# Patient Record
Sex: Male | Born: 1970 | Race: White | Hispanic: No | Marital: Single | Smoking: Current every day smoker
Health system: Southern US, Community
[De-identification: ages and names within clinical notes are randomized; demographics above are authoritative.]

## PROBLEM LIST (undated history)

## (undated) DIAGNOSIS — G43909 Migraine, unspecified, not intractable, without status migrainosus: Secondary | ICD-10-CM

## (undated) DIAGNOSIS — K219 Gastro-esophageal reflux disease without esophagitis: Secondary | ICD-10-CM

## (undated) DIAGNOSIS — R51 Headache: Secondary | ICD-10-CM

## (undated) DIAGNOSIS — R519 Headache, unspecified: Secondary | ICD-10-CM

## (undated) DIAGNOSIS — T7840XA Allergy, unspecified, initial encounter: Secondary | ICD-10-CM

## (undated) DIAGNOSIS — R7303 Prediabetes: Secondary | ICD-10-CM

## (undated) DIAGNOSIS — R569 Unspecified convulsions: Secondary | ICD-10-CM

## (undated) HISTORY — DX: Migraine, unspecified, not intractable, without status migrainosus: G43.909

## (undated) HISTORY — PX: APPENDECTOMY: SHX54

## (undated) HISTORY — DX: Unspecified convulsions: R56.9

## (undated) HISTORY — DX: Allergy, unspecified, initial encounter: T78.40XA

---

## 2015-03-16 ENCOUNTER — Emergency Department
Admit: 2015-03-16 | Disposition: A | Discharge status: Home / Self Care | Payer: Self-pay | Admitting: Emergency Medicine

## 2015-03-16 ENCOUNTER — Encounter: Payer: Self-pay | Admitting: Emergency Medicine

## 2015-03-16 LAB — CBC
HCT: 48.7 % (ref 40.0–52.0)
HGB: 16.2 g/dL (ref 13.0–18.0)
MCH: 30 pg (ref 26.0–34.0)
MCHC: 33.4 g/dL (ref 32.0–36.0)
MCV: 90 fL (ref 80–100)
Platelet: 206 10*3/uL (ref 150–440)
RBC: 5.41 10*6/uL (ref 4.40–5.90)
RDW: 14.8 % — ABNORMAL HIGH (ref 11.5–14.5)
WBC: 11.6 10*3/uL — AB (ref 3.8–10.6)

## 2015-03-16 LAB — BASIC METABOLIC PANEL
ANION GAP: 9 (ref 7–16)
BUN: 12 mg/dL
CHLORIDE: 102 mmol/L
Calcium, Total: 9.1 mg/dL
Co2: 27 mmol/L
Creatinine: 1.14 mg/dL
EGFR (African American): >60
EGFR (Non-African Amer.): >60
GLUCOSE: 93 mg/dL
Potassium: 4.2 mmol/L
Sodium: 138 mmol/L

## 2015-03-16 LAB — PROTIME-INR
INR: 0.9
PROTHROMBIN TIME: 12 s

## 2015-03-16 LAB — TROPONIN I: Troponin-I: <0.03 ng/mL

## 2015-04-09 ENCOUNTER — Encounter: Payer: Self-pay | Admitting: Emergency Medicine

## 2015-04-09 DIAGNOSIS — L03116 Cellulitis of left lower limb: Secondary | ICD-10-CM | POA: Insufficient documentation

## 2015-04-09 DIAGNOSIS — Z720 Tobacco use: Secondary | ICD-10-CM | POA: Insufficient documentation

## 2015-04-09 DIAGNOSIS — Z880 Allergy status to penicillin: Secondary | ICD-10-CM | POA: Insufficient documentation

## 2015-04-09 NOTE — ED Notes (Addendum)
Pt st last 3 days noted area of swelling/redness to left medial leg just below knee; now with pain into calf; denies hx of same; no known injury; skin hot to touch; +PP, brisk cap refill, W&D, brisk cap refill; pt recent car trip to [place]

## 2015-04-10 ENCOUNTER — Emergency Department
Admission: EM | Admit: 2015-04-10 | Discharge: 2015-04-10 | Disposition: A | Discharge status: Home / Self Care | Payer: Self-pay | Attending: Emergency Medicine | Admitting: Emergency Medicine

## 2015-04-10 ENCOUNTER — Emergency Department: Payer: Self-pay

## 2015-04-10 DIAGNOSIS — M79605 Pain in left leg: Secondary | ICD-10-CM

## 2015-04-10 DIAGNOSIS — L03116 Cellulitis of left lower limb: Secondary | ICD-10-CM

## 2015-04-10 MED ORDER — OXYCODONE-ACETAMINOPHEN 5-325 MG PO TABS
1.0000 | ORAL_TABLET | Freq: Once | ORAL | Status: AC
  Administered 2015-04-10: 1 via ORAL

## 2015-04-10 MED ORDER — SULFAMETHOXAZOLE-TRIMETHOPRIM 800-160 MG PO TABS: 2.0000 | ORAL_TABLET | Freq: Two times a day (BID) | ORAL | Status: DC

## 2015-04-10 MED ORDER — OXYCODONE-ACETAMINOPHEN 5-325 MG PO TABS
ORAL_TABLET | ORAL | Status: AC
  Administered 2015-04-10: 1 via ORAL
  Filled 2015-04-10: qty 1

## 2015-04-10 MED ORDER — IBUPROFEN 600 MG PO TABS: 600.0000 mg | ORAL_TABLET | Freq: Three times a day (TID) | ORAL | Status: DC | PRN

## 2015-04-10 MED ORDER — SULFAMETHOXAZOLE-TRIMETHOPRIM 800-160 MG PO TABS
ORAL_TABLET | ORAL | Status: AC
  Administered 2015-04-10: 2 via ORAL
  Filled 2015-04-10: qty 2

## 2015-04-10 MED ORDER — OXYCODONE-ACETAMINOPHEN 5-325 MG PO TABS: 1.0000 | ORAL_TABLET | ORAL | Status: DC | PRN

## 2015-04-10 MED ORDER — SULFAMETHOXAZOLE-TRIMETHOPRIM 800-160 MG PO TABS
2.0000 | ORAL_TABLET | Freq: Once | ORAL | Status: AC
  Administered 2015-04-10: 2 via ORAL

## 2015-04-10 NOTE — Discharge Instructions (Signed)
1. Take antibiotics as prescribed (Bactrim DS 2 tablets twice daily 10 days). 2. Take pain medicines as needed (Motrin/Percocet). 3. Elevate affected area several times a day and apply moist heat. 4. Return to the ER for worsening symptoms, fever, persistent vomiting or other concerns.  Cellulitis Cellulitis is an infection of the skin and the tissue beneath it. The infected area is usually red and tender. Cellulitis occurs most often in the arms and lower legs.  CAUSES  Cellulitis is caused by bacteria that enter the skin through cracks or cuts in the skin. The most common types of bacteria that cause cellulitis are staphylococci and streptococci. SIGNS AND SYMPTOMS   Redness and warmth.  Swelling.  Tenderness or pain.  Fever. DIAGNOSIS  Your health care provider can usually determine what is wrong based on a physical exam. Blood tests may also be done. TREATMENT  Treatment usually involves taking an antibiotic medicine. HOME CARE INSTRUCTIONS   Take your antibiotic medicine as directed by your health care provider. Finish the antibiotic even if you start to feel better.  Keep the infected arm or leg elevated to reduce swelling.  Apply a warm cloth to the affected area up to 4 times per day to relieve pain.  Take medicines only as directed by your health care provider.  Keep all follow-up visits as directed by your health care provider. SEEK MEDICAL CARE IF:   You notice red streaks coming from the infected area.  Your red area gets larger or turns dark in color.  Your bone or joint underneath the infected area becomes painful after the skin has healed.  Your infection returns in the same area or another area.  You notice a swollen bump in the infected area.  You develop new symptoms.  You have a fever. SEEK IMMEDIATE MEDICAL CARE IF:   You feel very sleepy.  You develop vomiting or diarrhea.  You have a general ill feeling (malaise) with muscle aches and  pains. MAKE SURE YOU:   Understand these instructions.  Will watch your condition.  Will get help right away if you are not doing well or get worse. Document Released: 08/18/2005 Document Revised: 03/25/2014 Document Reviewed: 01/24/2012 [facility] Patient Information 2015 [place], [place]. This information is not intended to replace advice given to you by your health care provider. Make sure you discuss any questions you have with your health care provider.  Heat Therapy Heat therapy can help ease sore, stiff, injured, and tight muscles and joints. Heat relaxes your muscles, which may help ease your pain.  RISKS AND COMPLICATIONS If you have any of the following conditions, do not use heat therapy unless your health care provider has approved:  Poor circulation.  Healing wounds or scarred skin in the area being treated.  Diabetes, heart disease, or high blood pressure.  Not being able to feel (numbness) the area being treated.  Unusual swelling of the area being treated.  Active infections.  Blood clots.  Cancer.  Inability to communicate pain. This may include young children and people who have problems with their brain function (dementia).  Pregnancy. Heat therapy should only be used on old, pre-existing, or long-lasting (chronic) injuries. Do not use heat therapy on new injuries unless directed by your health care provider. HOW TO USE HEAT THERAPY There are several different kinds of heat therapy, including:  Moist heat pack.  Warm water bath.  Hot water bottle.  Electric heating pad.  Heated gel pack.  Heated wrap.  IT trainer  heating pad. Use the heat therapy method suggested by your health care provider. Follow your health care provider's instructions on when and how to use heat therapy. GENERAL HEAT THERAPY RECOMMENDATIONS  Do not sleep while using heat therapy. Only use heat therapy while you are awake.  Your skin may turn pink while using heat therapy. Do  not use heat therapy if your skin turns red.  Do not use heat therapy if you have new pain.  High heat or long exposure to heat can cause burns. Be careful when using heat therapy to avoid burning your skin.  Do not use heat therapy on areas of your skin that are already irritated, such as with a rash or sunburn. SEEK MEDICAL CARE IF:  You have blisters, redness, swelling, or numbness.  You have new pain.  Your pain is worse. MAKE SURE YOU:  Understand these instructions.  Will watch your condition.  Will get help right away if you are not doing well or get worse. Document Released: 01/31/2012 Document Revised: 03/25/2014 Document Reviewed: 01/01/2014 [facility] Patient Information 2015 [place], [place]. This information is not intended to replace advice given to you by your health care provider. Make sure you discuss any questions you have with your health care provider.

## 2015-04-10 NOTE — ED Notes (Signed)

## 2015-04-10 NOTE — ED Provider Notes (Signed)
[facility] Emergency Department Provider Note  ____________________________________________  Time seen: Approximately 2:44 AM  I have reviewed the triage vital signs and the nursing notes.   HISTORY  Chief Complaint Leg Pain    HPI Todd Massey is a 44 y.o. male who presents with left lower extremity painand swelling 3 days. Patient complains of 7/10 pain to calf and posterior knee. History of recent car travel to [place]. Denies trauma/fall/injury. Noted area of swelling/redness to left medial leg just below the knee several days ago; area has not increased in size. Pain is exacerbated by standing and walking. Pain is unrelieved with elevation. Patient began to research his symptoms on the Internet and is concerned for a blood clot. Patient denies fever, chills, chest pain, shortness of breath, vomiting, diarrhea, numbness, tingling, weakness.   History reviewed. No pertinent past medical history.  No history of diabetes.  There are no active problems to display for this patient.   Past Surgical History  Procedure Laterality Date  . Appendectomy      Current Outpatient Rx  Name  Route  Sig  Dispense  Refill  . ibuprofen (ADVIL,MOTRIN) 600 MG tablet   Oral   Take 1 tablet (600 mg total) by mouth every 8 (eight) hours as needed.   15 tablet   0   . oxyCODONE-acetaminophen (ROXICET) 5-325 MG per tablet   Oral   Take 1 tablet by mouth every 4 (four) hours as needed for severe pain.   20 tablet   0   . sulfamethoxazole-trimethoprim (BACTRIM DS,SEPTRA DS) 800-160 MG per tablet   Oral   Take 2 tablets by mouth 2 (two) times daily.   40 tablet   0     Allergies Penicillins  No family history on file.  Social History History  Substance Use Topics  . Smoking status: Current Every Day Smoker -- 0.50 packs/day    Types: Cigarettes  . Smokeless tobacco: Not on file  . Alcohol Use: No    Review of Systems Constitutional: No  fever/chills Eyes: No visual changes. ENT: No sore throat. Cardiovascular: Denies chest pain. Respiratory: Denies shortness of breath. Gastrointestinal: No abdominal pain.  No nausea, no vomiting.  No diarrhea.  No constipation. Genitourinary: Negative for dysuria. Musculoskeletal: Negative for back pain. Positive for left lower extremity pain, redness and swelling. Skin: Negative for rash. Neurological: Negative for headaches, focal weakness or numbness.  10-point ROS otherwise negative.  ____________________________________________   PHYSICAL EXAM:  VITAL SIGNS: ED Triage Vitals  Enc Vitals Group     BP 04/09/15 2335 136/83 mmHg     Pulse Rate 04/09/15 2335 85     Resp 04/09/15 2335 18     Temp 04/09/15 2335 98.1 F (36.7 C)     Temp Source 04/09/15 2335 Oral     SpO2 04/09/15 2335 99 %     Weight 04/09/15 2335 190 lb (86.183 kg)     Height 04/09/15 2335 5\' 11"  (1.803 m)     Head Cir --      Peak Flow --      Pain Score 04/09/15 2335 7     Pain Loc --      Pain Edu? --      Excl. in [place]? --     Constitutional: Alert and oriented. Well appearing and in no acute distress. Eyes: Conjunctivae are normal. PERRL. EOMI. Head: Atraumatic. Nose: No congestion/rhinnorhea. Mouth/Throat: Mucous membranes are moist.  Oropharynx non-erythematous. Neck: No stridor.  Cardiovascular: Normal rate, regular rhythm. Grossly normal heart sounds.  Good peripheral circulation. Respiratory: Normal respiratory effort.  No retractions. Lungs CTAB. Gastrointestinal: Soft and nontender. No distention. No abdominal bruits. No CVA tenderness. Musculoskeletal: Right calf 40cm, left calf 41cm. Approximately 2 cm area of redness, warmth, swelling noted to left medial upper calf without induration or fluctuance. No bite mark noted. BLE symmetrically warm. 2+ distal pulses bilaterally. Full range of motion of left knee and ankle. Neurologic:  Normal speech and language. No gross focal neurologic  deficits are appreciated. Speech is normal. No gait instability. Skin:  Warmth and erythema to left medial upper calf as described above. No rash noted. Psychiatric: Mood and affect are normal. Speech and behavior are normal.  ____________________________________________   LABS (all labs ordered are listed, but only abnormal results are displayed)  Labs Reviewed - No data to display ____________________________________________  EKG  None ____________________________________________  RADIOLOGY  Left lower extremity venous Doppler ultrasound interpreted by Dr. Marisue Humble: No evidence of deep venous thrombosis. ____________________________________________   PROCEDURES  Procedure(s) performed: None  Critical Care performed: No  ____________________________________________   INITIAL IMPRESSION / ASSESSMENT AND PLAN / ED COURSE  Pertinent labs & imaging results that were available during my care of the patient were reviewed by me and considered in my medical decision making (see chart for details).  44 year old male who presents with left lower extremity pain, redness and swelling. Ultrasound negative for DVT. Symptoms more consistent with cellulitis. We will initiate antibiotics with Bactrim, analgesia, warm compresses and follow up PCP. Strict return precautions given. Patient and spouse verbalized understanding and agreed with plan of care. ____________________________________________   FINAL CLINICAL IMPRESSION(S) / ED DIAGNOSES  Final diagnoses:  Cellulitis of left lower extremity  Leg pain, medial, left      Paulette Blanch, MD 04/10/15 [phone]

## 2015-09-21 ENCOUNTER — Emergency Department
Admission: EM | Admit: 2015-09-21 | Discharge: 2015-09-21 | Disposition: A | Discharge status: Home / Self Care | Payer: Self-pay | Attending: Emergency Medicine | Admitting: Emergency Medicine

## 2015-09-21 ENCOUNTER — Emergency Department: Payer: Self-pay

## 2015-09-21 ENCOUNTER — Encounter: Payer: Self-pay | Admitting: Emergency Medicine

## 2015-09-21 DIAGNOSIS — Z720 Tobacco use: Secondary | ICD-10-CM | POA: Insufficient documentation

## 2015-09-21 DIAGNOSIS — Z79899 Other long term (current) drug therapy: Secondary | ICD-10-CM | POA: Insufficient documentation

## 2015-09-21 DIAGNOSIS — R079 Chest pain, unspecified: Secondary | ICD-10-CM | POA: Insufficient documentation

## 2015-09-21 DIAGNOSIS — Z880 Allergy status to penicillin: Secondary | ICD-10-CM | POA: Insufficient documentation

## 2015-09-21 LAB — COMPREHENSIVE METABOLIC PANEL
ALT: 43 U/L (ref 17–63)
ANION GAP: 9 (ref 5–15)
AST: 32 U/L (ref 15–41)
Albumin: 3.6 g/dL (ref 3.5–5.0)
Alkaline Phosphatase: 107 U/L (ref 38–126)
BILIRUBIN TOTAL: 0.3 mg/dL (ref 0.3–1.2)
BUN: 19 mg/dL (ref 6–20)
CHLORIDE: 106 mmol/L (ref 101–111)
CO2: 23 mmol/L (ref 22–32)
Calcium: 9.1 mg/dL (ref 8.9–10.3)
Creatinine, Ser: 1.09 mg/dL (ref 0.61–1.24)
Glucose, Bld: 99 mg/dL (ref 65–99)
POTASSIUM: 4 mmol/L (ref 3.5–5.1)
Sodium: 138 mmol/L (ref 135–145)
TOTAL PROTEIN: 7.1 g/dL (ref 6.5–8.1)

## 2015-09-21 LAB — CBC
HEMATOCRIT: 45.6 % (ref 40.0–52.0)
Hemoglobin: 15.7 g/dL (ref 13.0–18.0)
MCH: 30.3 pg (ref 26.0–34.0)
MCHC: 34.4 g/dL (ref 32.0–36.0)
MCV: 88.1 fL (ref 80.0–100.0)
PLATELETS: 186 10*3/uL (ref 150–440)
RBC: 5.17 MIL/uL (ref 4.40–5.90)
RDW: 14.3 % (ref 11.5–14.5)
WBC: 9.5 10*3/uL (ref 3.8–10.6)

## 2015-09-21 LAB — GLUCOSE, CAPILLARY: GLUCOSE-CAPILLARY: 95 mg/dL (ref 65–99)

## 2015-09-21 LAB — TROPONIN I
Troponin I: <0.03 ng/mL (ref <0.031)
Troponin I: <0.03 ng/mL (ref <0.031)

## 2015-09-21 MED ORDER — MORPHINE SULFATE (PF) 4 MG/ML IV SOLN
4.0000 mg | Freq: Once | INTRAVENOUS | Status: AC
  Administered 2015-09-21: 4 mg via INTRAVENOUS
  Filled 2015-09-21: qty 1

## 2015-09-21 MED ORDER — ASPIRIN 81 MG PO CHEW
324.0000 mg | CHEWABLE_TABLET | Freq: Once | ORAL | Status: AC
  Administered 2015-09-21: 324 mg via ORAL
  Filled 2015-09-21: qty 4

## 2015-09-21 MED ORDER — ONDANSETRON HCL 4 MG/2ML IJ SOLN
4.0000 mg | Freq: Once | INTRAMUSCULAR | Status: AC
  Administered 2015-09-21: 4 mg via INTRAVENOUS
  Filled 2015-09-21: qty 2

## 2015-09-21 NOTE — ED Notes (Signed)
MD at bedside for reeval

## 2015-09-21 NOTE — ED Notes (Signed)
Report given to [place], Shelly Bombard.

## 2015-09-21 NOTE — ED Notes (Signed)
Pt says he was at work when he began having pain to the upper left chest and left bicep area; tightness in chest; numbness and tingling to left arm; diaphoresis when pain started; pain also radiates around left axillary area;

## 2015-09-21 NOTE — ED Notes (Signed)
Discussed discharge instructions and follow-up care with patient. No questions or concerns at this time. Pt stable at discharge.  

## 2015-09-21 NOTE — ED Notes (Signed)
Assumed pt care at this time. NAD noted. RR even and nonlabored. Will continue to monitor. 

## 2015-09-21 NOTE — ED Provider Notes (Signed)
[facility] Emergency Department Provider Note  ____________________________________________  Time seen: Approximately 110 AM  I have reviewed the triage vital signs and the nursing notes.   HISTORY  Chief Complaint Chest Pain   HPI Todd Massey is a 44 y.o. male without any chronic medical conditions is presenting tonight with chest pain that started about an hour prior to arrival. He says that he was at work standing when he had sudden onset of left-sided chest pain. He said that it was associated with diaphoresis and nausea. He says he had a similar episode about 6 months ago was supposed to follow up with cardiology would never did. He says that his pain is decreased at this time and is a 4 out of 10. He says he had some radiation to his left upper extremity with tingling. He denies any family history of heart disease. Says he smokes about half pack of cigarettes per day. However, does not of any chronic medical conditions. He says that recently when he has been walking or exerting himself he has not noticed any chest pain or shortness of breath.   History reviewed. No pertinent past medical history.  There are no active problems to display for this patient.   Past Surgical History  Procedure Laterality Date  . Appendectomy      Current Outpatient Rx  Name  Route  Sig  Dispense  Refill  . ibuprofen (ADVIL,MOTRIN) 600 MG tablet   Oral   Take 1 tablet (600 mg total) by mouth every 8 (eight) hours as needed.   15 tablet   0   . oxyCODONE-acetaminophen (ROXICET) 5-325 MG per tablet   Oral   Take 1 tablet by mouth every 4 (four) hours as needed for severe pain.   20 tablet   0   . sulfamethoxazole-trimethoprim (BACTRIM DS,SEPTRA DS) 800-160 MG per tablet   Oral   Take 2 tablets by mouth 2 (two) times daily.   40 tablet   0     Allergies Penicillins  History reviewed. No pertinent family history.  Social History Social History  Substance  Use Topics  . Smoking status: Current Every Day Smoker -- 0.50 packs/day    Types: Cigarettes  . Smokeless tobacco: Never Used  . Alcohol Use: No    Review of Systems Constitutional: No fever/chills Eyes: No visual changes. ENT: No sore throat. Cardiovascular: As above Respiratory: Denies shortness of breath. Gastrointestinal: No abdominal pain.   no vomiting.  No diarrhea.  No constipation. Genitourinary: Negative for dysuria. Musculoskeletal: Negative for back pain. Skin: Negative for rash. Neurological: Negative for headaches, focal weakness or numbness.  10-point ROS otherwise negative.  ____________________________________________   PHYSICAL EXAM:  VITAL SIGNS: ED Triage Vitals  Enc Vitals Group     BP 09/21/15 0030 135/91 mmHg     Pulse Rate 09/21/15 0030 86     Resp 09/21/15 0030 18     Temp 09/21/15 0030 98.1 F (36.7 C)     Temp Source 09/21/15 0030 Oral     SpO2 09/21/15 0030 96 %     Weight 09/21/15 0030 226 lb (102.513 kg)     Height 09/21/15 0030 5' 11.5" (1.816 m)     Head Cir --      Peak Flow --      Pain Score 09/21/15 0038 6     Pain Loc --      Pain Edu? --      Excl. in [place]? --  Constitutional: Alert and oriented. Well appearing and in no acute distress. Eyes: Conjunctivae are normal. PERRL. EOMI. Head: Atraumatic. Nose: No congestion/rhinnorhea. Mouth/Throat: Mucous membranes are moist.  Oropharynx non-erythematous. Neck: No stridor.   Cardiovascular: Normal rate, regular rhythm. Grossly normal heart sounds.  Good peripheral circulation. Chest pain is reproducible on palpation of the left side of the chest. Respiratory: Normal respiratory effort.  No retractions. Lungs CTAB. Gastrointestinal: Soft and nontender. No distention. No abdominal bruits. No CVA tenderness. Musculoskeletal: No lower extremity tenderness nor edema.  No joint effusions. Neurologic:  Normal speech and language. No gross focal neurologic deficits are appreciated. No  gait instability. Skin:  Skin is warm, dry and intact. No rash noted. Psychiatric: Mood and affect are normal. Speech and behavior are normal.  ____________________________________________   LABS (all labs ordered are listed, but only abnormal results are displayed)  Labs Reviewed  CBC  COMPREHENSIVE METABOLIC PANEL  TROPONIN I  GLUCOSE, CAPILLARY  TROPONIN I   ____________________________________________  EKG  ED ECG REPORT I, [place],  Youlanda Roys, the attending physician, personally viewed and interpreted this ECG.   Date: 09/21/2015  EKG Time: 12:36 AM  Rate: 76  Rhythm: normal sinus rhythm  Axis: Normal axis  Intervals:none  ST&T Change: No ST segment elevation or depression. No abnormal T-wave inversion.  ED ECG REPORT I, Doran Stabler, the attending physician, personally viewed and interpreted this ECG.   Date: 09/21/2015  EKG Time: 0055  Rate: 76  Rhythm: normal sinus rhythm  Axis: Normal axis  Intervals:none  ST&T Change: No abnormal ST elevation or depression. No abnormal T-wave inversion. No significant change from previous.   ____________________________________________  RADIOLOGY  No edema or consolidation on the chest x-ray. ____________________________________________   PROCEDURES  ____________________________________________   INITIAL IMPRESSION / ASSESSMENT AND PLAN / ED COURSE  Pertinent labs & imaging results that were available during my care of the patient were reviewed by me and considered in my medical decision making (see chart for details).  ----------------------------------------- 6:59 AM on 09/21/2015 -----------------------------------------  Patient is chest pain-free at this time and resting comfortably. I discussed with him his lab results as well as his EKG and imaging which are all reassuring. He says that he has similar episode 6 months ago as well. I discussed the case with Dr. Humphrey Rolls of cardiology who will see the  patient tomorrow at 10 AM. I discussed with the patient the possibility of admission but he would rather be seen in the office. He is concerned because he does not have insurance. However, Dr. Humphrey Rolls has agreed to see him. Will be discharged home. Has aspirin at home and will begin taking a daily baby aspirin. He has a family member at the bedside and understands the plan and is willing to comply. Has a heart score of 3 which makes him low risk. PERC negative. ____________________________________________   FINAL CLINICAL IMPRESSION(S) / ED DIAGNOSES  Chest pain.    Todd Pyo, MD 09/21/15 [phone]

## 2015-12-10 ENCOUNTER — Other Ambulatory Visit: Payer: Self-pay | Admitting: Physician Assistant

## 2015-12-10 ENCOUNTER — Other Ambulatory Visit (HOSPITAL_COMMUNITY): Payer: Self-pay | Admitting: Physician Assistant

## 2015-12-10 DIAGNOSIS — R1310 Dysphagia, unspecified: Secondary | ICD-10-CM

## 2015-12-10 DIAGNOSIS — R1011 Right upper quadrant pain: Secondary | ICD-10-CM

## 2015-12-17 ENCOUNTER — Ambulatory Visit
Admission: RE | Admit: 2015-12-17 | Discharge: 2015-12-17 | Disposition: A | Discharge status: Home / Self Care | Payer: BLUE CROSS/BLUE SHIELD | Source: Ambulatory Visit | Attending: Physician Assistant | Admitting: Physician Assistant

## 2015-12-17 DIAGNOSIS — R1310 Dysphagia, unspecified: Secondary | ICD-10-CM

## 2015-12-26 ENCOUNTER — Ambulatory Visit (HOSPITAL_COMMUNITY)
Admission: RE | Admit: 2015-12-26 | Discharge: 2015-12-26 | Disposition: A | Discharge status: Home / Self Care | Payer: BLUE CROSS/BLUE SHIELD | Source: Ambulatory Visit | Attending: Physician Assistant | Admitting: Physician Assistant

## 2015-12-26 DIAGNOSIS — R1011 Right upper quadrant pain: Secondary | ICD-10-CM | POA: Diagnosis present

## 2015-12-26 MED ORDER — SINCALIDE 5 MCG IJ SOLR
0.0200 ug/kg | Freq: Once | INTRAMUSCULAR | Status: AC
  Administered 2015-12-26: 2.2 ug via INTRAVENOUS

## 2015-12-26 MED ORDER — TECHNETIUM TC 99M MEBROFENIN IV KIT
5.4000 | PACK | Freq: Once | INTRAVENOUS | Status: AC | PRN
  Administered 2015-12-26: 5 via INTRAVENOUS

## 2016-03-24 ENCOUNTER — Encounter (HOSPITAL_COMMUNITY): Payer: Self-pay | Admitting: *Deleted

## 2016-03-30 ENCOUNTER — Other Ambulatory Visit: Payer: Self-pay | Admitting: Gastroenterology

## 2016-03-31 ENCOUNTER — Encounter (HOSPITAL_COMMUNITY)
Admission: RE | Disposition: A | Discharge status: Home / Self Care | Payer: Self-pay | Source: Ambulatory Visit | Attending: Gastroenterology

## 2016-03-31 ENCOUNTER — Encounter (HOSPITAL_COMMUNITY): Payer: Self-pay

## 2016-03-31 ENCOUNTER — Ambulatory Visit (HOSPITAL_COMMUNITY): Payer: BLUE CROSS/BLUE SHIELD | Admitting: Anesthesiology

## 2016-03-31 ENCOUNTER — Ambulatory Visit (HOSPITAL_COMMUNITY)
Admission: RE | Admit: 2016-03-31 | Discharge: 2016-03-31 | Disposition: A | Discharge status: Home / Self Care | Payer: BLUE CROSS/BLUE SHIELD | Source: Ambulatory Visit | Attending: Gastroenterology | Admitting: Gastroenterology

## 2016-03-31 DIAGNOSIS — K3189 Other diseases of stomach and duodenum: Secondary | ICD-10-CM | POA: Diagnosis not present

## 2016-03-31 DIAGNOSIS — Z79899 Other long term (current) drug therapy: Secondary | ICD-10-CM | POA: Insufficient documentation

## 2016-03-31 DIAGNOSIS — R1013 Epigastric pain: Secondary | ICD-10-CM | POA: Insufficient documentation

## 2016-03-31 DIAGNOSIS — F17210 Nicotine dependence, cigarettes, uncomplicated: Secondary | ICD-10-CM | POA: Diagnosis not present

## 2016-03-31 DIAGNOSIS — E785 Hyperlipidemia, unspecified: Secondary | ICD-10-CM | POA: Insufficient documentation

## 2016-03-31 DIAGNOSIS — R945 Abnormal results of liver function studies: Secondary | ICD-10-CM | POA: Diagnosis present

## 2016-03-31 DIAGNOSIS — K317 Polyp of stomach and duodenum: Secondary | ICD-10-CM | POA: Diagnosis not present

## 2016-03-31 DIAGNOSIS — K219 Gastro-esophageal reflux disease without esophagitis: Secondary | ICD-10-CM | POA: Insufficient documentation

## 2016-03-31 DIAGNOSIS — I10 Essential (primary) hypertension: Secondary | ICD-10-CM | POA: Diagnosis not present

## 2016-03-31 DIAGNOSIS — J449 Chronic obstructive pulmonary disease, unspecified: Secondary | ICD-10-CM | POA: Diagnosis not present

## 2016-03-31 HISTORY — DX: Gastro-esophageal reflux disease without esophagitis: K21.9

## 2016-03-31 HISTORY — DX: Headache: R51

## 2016-03-31 HISTORY — DX: Headache, unspecified: R51.9

## 2016-03-31 HISTORY — PX: EUS: SHX5427

## 2016-03-31 SURGERY — ESOPHAGEAL ENDOSCOPIC ULTRASOUND (EUS) RADIAL
Anesthesia: Monitor Anesthesia Care

## 2016-03-31 MED ORDER — SODIUM CHLORIDE 0.9 % IV SOLN: INTRAVENOUS | Status: DC

## 2016-03-31 MED ORDER — PROPOFOL 10 MG/ML IV BOLUS
INTRAVENOUS | Status: DC | PRN
Start: 2016-03-31 — End: 2016-03-31
  Administered 2016-03-31: 50 mg via INTRAVENOUS
  Administered 2016-03-31: 20 mg via INTRAVENOUS

## 2016-03-31 MED ORDER — PROPOFOL 10 MG/ML IV BOLUS
INTRAVENOUS | Status: AC
  Filled 2016-03-31: qty 40

## 2016-03-31 MED ORDER — PROPOFOL 10 MG/ML IV BOLUS
INTRAVENOUS | Status: AC
  Filled 2016-03-31: qty 20

## 2016-03-31 MED ORDER — LACTATED RINGERS IV SOLN
INTRAVENOUS | Status: DC
  Administered 2016-03-31: 1000 mL via INTRAVENOUS

## 2016-03-31 MED ORDER — LIDOCAINE HCL (CARDIAC) 20 MG/ML IV SOLN
INTRAVENOUS | Status: DC | PRN
  Administered 2016-03-31: 50 mg via INTRAVENOUS

## 2016-03-31 MED ORDER — LIDOCAINE HCL (CARDIAC) 20 MG/ML IV SOLN
INTRAVENOUS | Status: AC
  Filled 2016-03-31: qty 5

## 2016-03-31 MED ORDER — PROPOFOL 500 MG/50ML IV EMUL
INTRAVENOUS | Status: DC | PRN
  Administered 2016-03-31: 150 ug/kg/min via INTRAVENOUS

## 2016-03-31 NOTE — Transfer of Care (Signed)
Immediate Anesthesia Transfer of Care Note  Patient: Todd Massey  Procedure(s) Performed: Procedure(s): ESOPHAGEAL ENDOSCOPIC ULTRASOUND (EUS) RADIAL (N/A)  Patient Location: PACU  Anesthesia Type:MAC  Level of Consciousness: awake, alert  and oriented  Airway & Oxygen Therapy: Patient Spontanous Breathing and Patient connected to nasal cannula oxygen  Post-op Assessment: Report given to RN and Post -op Vital signs reviewed and stable  Post vital signs: Reviewed and stable  Last Vitals:  Filed Vitals:   03/31/16 0721  BP: 127/82  Pulse: 78  Temp: 36.6 C  Resp: 18    Last Pain: There were no vitals filed for this visit.       Complications: No apparent anesthesia complications

## 2016-03-31 NOTE — H&P (Signed)
Patient interval history reviewed.  Patient examined again.  There has been no change from documented H/P dated 03/30/16 (scanned into chart from our office) except as documented above.  Assessment:  1.  Epigastric and right upper quadrant abdominal pain. 2.  Elevated LFTs.  Plan:  1.  Endoscopy and upper endoscopic ultrasound. 2.  Risks (bleeding, infection, bowel perforation that could require surgery, sedation-related changes in cardiopulmonary systems), benefits (identification and possible treatment of source of symptoms, exclusion of certain causes of symptoms), and alternatives (watchful waiting, radiographic imaging studies, empiric medical treatment) of upper endoscopy and upper endoscopic ultrasound (EGD + EUS) were explained to patient/family in detail and patient wishes to proceed.

## 2016-03-31 NOTE — Anesthesia Postprocedure Evaluation (Signed)
Anesthesia Post Note  Patient: Todd Massey  Procedure(s) Performed: Procedure(s) (LRB): ESOPHAGEAL ENDOSCOPIC ULTRASOUND (EUS) RADIAL (N/A)  Patient location during evaluation: Endoscopy Anesthesia Type: MAC Level of consciousness: awake and alert Pain management: pain level controlled Vital Signs Assessment: post-procedure vital signs reviewed and stable Respiratory status: spontaneous breathing, nonlabored ventilation, respiratory function stable and patient connected to nasal cannula oxygen Cardiovascular status: stable and blood pressure returned to baseline Anesthetic complications: no    Last Vitals:  Filed Vitals:   03/31/16 0920 03/31/16 0930  BP: 116/62 118/80  Pulse: 79 72  Temp:    Resp: 14 19    Last Pain: There were no vitals filed for this visit.               [name]

## 2016-03-31 NOTE — Discharge Instructions (Signed)

## 2016-03-31 NOTE — Anesthesia Preprocedure Evaluation (Addendum)
Anesthesia Evaluation  Patient identified by MRN, date of birth, ID band Patient awake    Reviewed: Allergy & Precautions, NPO status , Patient's Chart, lab work & pertinent test results  Airway Mallampati: II  TM Distance: >3 FB Neck ROM: Full    Dental no notable dental hx.    Pulmonary Current Smoker,    Pulmonary exam normal breath sounds clear to auscultation       Cardiovascular negative cardio ROS Normal cardiovascular exam Rhythm:Regular Rate:Normal     Neuro/Psych negative neurological ROS  negative psych ROS   GI/Hepatic negative GI ROS, Neg liver ROS,   Endo/Other  negative endocrine ROS  Renal/GU negative Renal ROS  negative genitourinary   Musculoskeletal negative musculoskeletal ROS (+)   Abdominal   Peds negative pediatric ROS (+)  Hematology negative hematology ROS (+)   Anesthesia Other Findings   Reproductive/Obstetrics negative OB ROS                            Anesthesia Physical Anesthesia Plan  ASA: II  Anesthesia Plan: MAC   Post-op Pain Management:    Induction: Intravenous  Airway Management Planned: Natural Airway  Additional Equipment:   Intra-op Plan:   Post-operative Plan: Extubation in OR  Informed Consent: I have reviewed the patients History and Physical, chart, labs and discussed the procedure including the risks, benefits and alternatives for the proposed anesthesia with the patient or authorized representative who has indicated his/her understanding and acceptance.   Dental advisory given  Plan Discussed with: CRNA  Anesthesia Plan Comments:         Anesthesia Quick Evaluation

## 2016-03-31 NOTE — Op Note (Signed)
[facility] Patient Name: Todd Massey Procedure Date: 03/31/2016 MRN: [id number] Attending MD: Arta Silence , MD Date of Birth: Oct 26, 1971 CSN: [id number] Age: 45 Admit Type: Outpatient Procedure:                Upper EUS Indications:              Elevated liver enzymes, Epigastric abdominal pain,                            Abdominal pain in the right upper quadrant Providers:                Arta Silence, MD, Dustin Flock, RN, Alfonso Patten,                            Technician, [facility], CRNA Referring MD:              Medicines:                Propofol per Anesthesia Complications:            No immediate complications. Estimated blood loss:                            None. Estimated Blood Loss:     Estimated blood loss: none. Procedure:                Pre-Anesthesia Assessment:                           - Prior to the procedure, a History and Physical                            was performed, and patient medications and                            allergies were reviewed. The patient's tolerance of                            previous anesthesia was also reviewed. The risks                            and benefits of the procedure and the sedation                            options and risks were discussed with the patient.                            All questions were answered, and informed consent                            was obtained. Prior Anticoagulants: The patient has                            taken no previous anticoagulant or antiplatelet  agents. ASA Grade Assessment: II - A patient with                            mild systemic disease. After reviewing the risks                            and benefits, the patient was deemed in                            satisfactory condition to undergo the procedure.                           After obtaining informed consent, the endoscope was                            passed  under direct vision. Throughout the                            procedure, the patient's blood pressure, pulse, and                            oxygen saturations were monitored continuously. The                            [id number]) scope was introduced through                            the mouth, and advanced to the second part of                            duodenum. The [id number]) scope was                            introduced through the mouth, and advanced to the                            second part of duodenum. The upper EUS was                            accomplished without difficulty. The patient                            tolerated the procedure well. Scope In: Scope Out: Findings:      Endoscopic Finding :      The examined esophagus was normal.      A single 10 mm sessile polyp was found in the cardia. Biopsies were       taken with a cold forceps for histology.      The exam of the stomach was otherwise normal.      The duodenal bulb, first portion of the duodenum and second portion of       the duodenum were normal.      Endosonographic Finding :      There was no sign of significant endosonographic abnormality in the  lower third of the esophagus.      There was diffuse abnormal echotexture in the left lobe of the liver.       This was characterized by a hyperechoic appearance.      There was no sign of significant endosonographic abnormality in the       common bile duct, in the cystic duct and in the gallbladder. The maximum       diameter of the ducts were 3 mm. An unremarkable gallbladder was       identified.      There was no sign of significant endosonographic abnormality in the       pancreatic head, in the pancreatic body, in the pancreatic tail, in the       uncinate process of the pancreas and in the ampulla.      Vague 2 x 2 cm hypo/anechoic doppler-negative region near genu of       pancreas in gastrohepatic region; doesn't seem  part of pancreas, may be       cystic region or lymph node. Apparently not seen on patient's recent CT       scan. Impression:               - Normal esophagus.                           - A single gastric polyp. Biopsied.                           - Normal duodenal bulb, first portion of the                            duodenum and second portion of the duodenum.                           - There was no sign of significant pathology in the                            lower third of the esophagus.                           - There was diffuse abnormal echotexture in the                            left lobe of the liver. This was characterized by a                            hyperechoic appearance. Likely hepatic steatosis.                           - There was no sign of significant pathology in the                            common bile duct, in the cystic duct and in the                            gallbladder.                           -  There was no sign of significant pathology in the                            pancreatic head, in the pancreatic body, in the                            pancreatic tail and in the uncinate process of the                            pancreas.                           - Vague hypo/anechoic region in gastrohepatic                            region of doubtful significance.                           - No explanation for patient's abdominal pain was                            identified. Moderate Sedation:      None Recommendation:           - Discharge patient to home (via wheelchair).                           - Resume previous diet today.                           - Await path results.                           - Return to GI clinic in 6 weeks. Might consider                            MRI/MRCP for further evaluation of vague                            gastrohepatic region anomaly.                           - Return to referring physician as previously                             scheduled. Procedure Code(s):        --- Professional ---                           [phone], Esophagogastroduodenoscopy, flexible,                            transoral; with endoscopic ultrasound examination,                            including the esophagus, stomach, and either the  duodenum or a surgically altered stomach where the                            jejunum is examined distal to the anastomosis                           43239, 59, Esophagogastroduodenoscopy, flexible,                            transoral; with biopsy, single or multiple Diagnosis Code(s):        --- Professional ---                           K31.7, Polyp of stomach and duodenum                           R74.8, Abnormal levels of other serum enzymes                           R10.13, Epigastric pain                           R10.11, Right upper quadrant pain                           R93.2, Abnormal findings on diagnostic imaging of                            liver and biliary tract CPT copyright 2016 American [employer] Association. All rights reserved. The codes documented in this report are preliminary and upon coder review may  be revised to meet current compliance requirements. Arta Silence, MD 03/31/2016 9:04:36 AM This report has been signed electronically. Number of Addenda: 0

## 2016-04-01 ENCOUNTER — Encounter (HOSPITAL_COMMUNITY): Payer: Self-pay | Admitting: Gastroenterology

## 2016-04-09 ENCOUNTER — Other Ambulatory Visit: Payer: Self-pay | Admitting: Gastroenterology

## 2016-04-09 DIAGNOSIS — R1011 Right upper quadrant pain: Secondary | ICD-10-CM

## 2016-04-16 ENCOUNTER — Ambulatory Visit: Payer: BLUE CROSS/BLUE SHIELD

## 2016-04-29 ENCOUNTER — Ambulatory Visit: Payer: BLUE CROSS/BLUE SHIELD

## 2016-04-29 ENCOUNTER — Ambulatory Visit
Admission: RE | Admit: 2016-04-29 | Discharge: 2016-04-29 | Disposition: A | Discharge status: Home / Self Care | Payer: BLUE CROSS/BLUE SHIELD | Source: Ambulatory Visit | Attending: Gastroenterology | Admitting: Gastroenterology

## 2016-04-29 DIAGNOSIS — K760 Fatty (change of) liver, not elsewhere classified: Secondary | ICD-10-CM | POA: Diagnosis not present

## 2016-04-29 DIAGNOSIS — R1011 Right upper quadrant pain: Secondary | ICD-10-CM | POA: Diagnosis present

## 2016-04-29 MED ORDER — GADOBENATE DIMEGLUMINE 529 MG/ML IV SOLN
20.0000 mL | Freq: Once | INTRAVENOUS | Status: AC | PRN
  Administered 2016-04-29: 20 mL via INTRAVENOUS

## 2016-08-04 ENCOUNTER — Other Ambulatory Visit: Payer: Self-pay | Admitting: Gastroenterology

## 2016-08-04 DIAGNOSIS — R198 Other specified symptoms and signs involving the digestive system and abdomen: Secondary | ICD-10-CM

## 2016-08-09 ENCOUNTER — Inpatient Hospital Stay: Admission: RE | Admit: 2016-08-09 | Payer: BLUE CROSS/BLUE SHIELD | Source: Ambulatory Visit

## 2017-06-12 ENCOUNTER — Emergency Department
Admission: EM | Admit: 2017-06-12 | Discharge: 2017-06-12 | Disposition: A | Discharge status: Home / Self Care | Payer: BLUE CROSS/BLUE SHIELD | Attending: Emergency Medicine | Admitting: Emergency Medicine

## 2017-06-12 ENCOUNTER — Encounter: Payer: Self-pay | Admitting: Emergency Medicine

## 2017-06-12 ENCOUNTER — Emergency Department: Payer: BLUE CROSS/BLUE SHIELD

## 2017-06-12 DIAGNOSIS — Z79899 Other long term (current) drug therapy: Secondary | ICD-10-CM | POA: Diagnosis not present

## 2017-06-12 DIAGNOSIS — S39012A Strain of muscle, fascia and tendon of lower back, initial encounter: Secondary | ICD-10-CM | POA: Diagnosis not present

## 2017-06-12 DIAGNOSIS — Y999 Unspecified external cause status: Secondary | ICD-10-CM | POA: Diagnosis not present

## 2017-06-12 DIAGNOSIS — F17210 Nicotine dependence, cigarettes, uncomplicated: Secondary | ICD-10-CM | POA: Insufficient documentation

## 2017-06-12 DIAGNOSIS — Y939 Activity, unspecified: Secondary | ICD-10-CM | POA: Diagnosis not present

## 2017-06-12 DIAGNOSIS — M545 Low back pain: Secondary | ICD-10-CM | POA: Diagnosis present

## 2017-06-12 DIAGNOSIS — X58XXXA Exposure to other specified factors, initial encounter: Secondary | ICD-10-CM | POA: Diagnosis not present

## 2017-06-12 DIAGNOSIS — Y929 Unspecified place or not applicable: Secondary | ICD-10-CM | POA: Insufficient documentation

## 2017-06-12 LAB — URINALYSIS, COMPLETE (UACMP) WITH MICROSCOPIC
Bacteria, UA: NONE SEEN
Bilirubin Urine: NEGATIVE
Glucose, UA: NEGATIVE mg/dL
Hgb urine dipstick: NEGATIVE
KETONES UR: NEGATIVE mg/dL
LEUKOCYTES UA: NEGATIVE
NITRITE: NEGATIVE
PH: 6 (ref 5.0–8.0)
Protein, ur: NEGATIVE mg/dL
RBC / HPF: NONE SEEN RBC/hpf (ref 0–5)
Specific Gravity, Urine: 1.018 (ref 1.005–1.030)

## 2017-06-12 MED ORDER — ORPHENADRINE CITRATE 30 MG/ML IJ SOLN
60.0000 mg | INTRAMUSCULAR | Status: AC
  Administered 2017-06-12: 60 mg via INTRAMUSCULAR
  Filled 2017-06-12: qty 2

## 2017-06-12 MED ORDER — ORPHENADRINE CITRATE ER 100 MG PO TB12
100.0000 mg | ORAL_TABLET | Freq: Two times a day (BID) | ORAL | 0 refills | Status: DC | PRN
Start: 2017-06-12 — End: 2018-12-27

## 2017-06-12 MED ORDER — KETOROLAC TROMETHAMINE 10 MG PO TABS: 10.0000 mg | ORAL_TABLET | Freq: Three times a day (TID) | ORAL | 0 refills | Status: DC

## 2017-06-12 MED ORDER — KETOROLAC TROMETHAMINE 60 MG/2ML IM SOLN
60.0000 mg | Freq: Once | INTRAMUSCULAR | Status: AC
  Administered 2017-06-12: 60 mg via INTRAMUSCULAR
  Filled 2017-06-12: qty 2

## 2017-06-12 NOTE — ED Notes (Signed)
Pt states he has a hx of sciatica and went to PCP for treatment. Pt states treatment worked and that the pain he is experiencing today feel different.

## 2017-06-12 NOTE — ED Provider Notes (Signed)
[facility] Emergency Department Provider Note ____________________________________________  Time seen: 1104  I have reviewed the triage vital signs and the nursing notes.  HISTORY  Chief Complaint  Back Pain  HPI Todd Massey is a 46 y.o. male presents to the ED for evaluation of low back pain for the last 2 weeks. Patient describes a history of sciatica, reports that treatment through his primary care provider initially worked. He notes that was his first episode of sciatica, and was treated 5 weeks prior. He is experiencing pain today that he describes as different from his typical sciatica pain. He is reporting left flank pain and intermittent pain to the anterior left thigh. This pain has been persistent since 2 weeks ago. He notes the pain is crampy, constant, and steady in nature. He denies recent injury, trauma, or fall; and any bladder or bowel incontinence, foot drop, or leg weakness.He also denies hematuria, retention, or dysuria. He has dosed IBU 600 mg BID over the last day or so.   Past Medical History:  Diagnosis Date  . GERD (gastroesophageal reflux disease)   . Headache    migraines    There are no active problems to display for this patient.   Past Surgical History:  Procedure Laterality Date  . APPENDECTOMY    . EUS N/A 03/31/2016   Procedure: ESOPHAGEAL ENDOSCOPIC ULTRASOUND (EUS) RADIAL;  Surgeon: Arta Silence, MD;  Location: [facility] ENDOSCOPY;  Service: Endoscopy;  Laterality: N/A;    Prior to Admission medications   Medication Sig Start Date End Date Taking? Authorizing Provider  albuterol (PROAIR HFA) 108 (90 Base) MCG/ACT inhaler Inhale 2 puffs into the lungs every 6 (six) hours as needed for wheezing or shortness of breath.  11/04/15 11/03/16  [provider]  atorvastatin (LIPITOR) 40 MG tablet Take 40 mg by mouth daily.    [provider]  ibuprofen (ADVIL,MOTRIN) 200 MG tablet Take 400 mg by mouth every 6 (six)  hours as needed (For headache or body aches.).    [provider]  ketorolac (TORADOL) 10 MG tablet Take 1 tablet (10 mg total) by mouth every 8 (eight) hours. 06/12/17   Mckinley Adelstein, Dannielle Karvonen, PA-C  omeprazole (PRILOSEC) 40 MG capsule Take 40 mg by mouth daily.    [provider]  orphenadrine (NORFLEX) 100 MG tablet Take 1 tablet (100 mg total) by mouth 2 (two) times daily as needed for muscle spasms. 06/12/17   Haidyn Chadderdon, Dannielle Karvonen, PA-C    Allergies Penicillins  No family history on file.  Social History Social History  Substance Use Topics  . Smoking status: Current Every Day Smoker    Packs/day: 0.50    Types: Cigarettes  . Smokeless tobacco: Never Used  . Alcohol use No    Review of Systems  Constitutional: Negative for fever. Cardiovascular: Negative for chest pain. Respiratory: Negative for shortness of breath. Gastrointestinal: Negative for abdominal pain, vomiting and diarrhea. Reports left flank pain.  Genitourinary: Negative for dysuria. Musculoskeletal: Positive for back pain. Skin: Negative for rash. Neurological: Negative for headaches, focal weakness or numbness. ____________________________________________  PHYSICAL EXAM:  VITAL SIGNS: ED Triage Vitals  Enc Vitals Group     BP 06/12/17 1051 (!) 151/92     Pulse Rate 06/12/17 1051 78     Resp 06/12/17 1051 16     Temp 06/12/17 1051 98.7 F (37.1 C)     Temp Source 06/12/17 1051 Oral     SpO2 06/12/17 1051 96 %  Weight --      Height --      Head Circumference --      Peak Flow --      Pain Score 06/12/17 1049 8     Pain Loc --      Pain Edu? --      Excl. in [place]? --    Constitutional: Alert and oriented. Well appearing and in no distress. Head: Normocephalic and atraumatic. Cardiovascular: Normal rate, regular rhythm. Normal distal pulses. Respiratory: Normal respiratory effort. No wheezes/rales/rhonchi. Gastrointestinal: Soft and nontender. No  distention. Musculoskeletal: Normal spinal alignment without midline tenderness, spasm, deformity, or step-off. Patient is tender palpation primarily to the right thoracolumbar region and right flank. Normal lumbar extension and flexion ROM. He demonstrates normal toe & heel raises. Nontender with normal range of motion in all extremities.  Neurologic:  Mildly antalgic gait without ataxia. Negative supine SLE bilaterally. Normal LE DTRs bilaterally. Normal speech and language. No gross focal neurologic deficits are appreciated. Skin:  Skin is warm, dry and intact. No rash noted. ____________________________________________   LABS (pertinent positives/negatives)  Labs Reviewed  URINALYSIS, COMPLETE (UACMP) WITH MICROSCOPIC - Abnormal; Notable for the following:       Result Value   Color, Urine YELLOW (*)    APPearance CLEAR (*)    Squamous Epithelial / LPF 0-5 (*)    All other components within normal limits  ____________________________________________  RADIOLOGY  Lumbar Spine  IMPRESSION: No acute abnormalities.  I, Kalany Diekmann, Dannielle Karvonen, personally viewed and evaluated these images (plain radiographs) as part of my medical decision making, as well as reviewing the written report by the radiologist. ____________________________________________  PROCEDURES  Toradol 30 mg IM Norflex 60 mg IM ____________________________________________  INITIAL IMPRESSION / ASSESSMENT AND PLAN / ED COURSE  Patient with marked improvement of his symptoms after IM medication administration. He is reassured by his negative urinalysis and x-rays. He is agreeable to dose the prescription Toradol and Norflex. He will follow-up with his PCP or employee health nurse. Return precautions are reviewed. A work note is provided for tonight as requested.  ____________________________________________  FINAL CLINICAL IMPRESSION(S) / ED DIAGNOSES  Final diagnoses:  Strain of lumbar region, initial  encounter      Melvenia Needles, PA-C 06/12/17 1625    Lisa Roca, MD 06/14/17 [phone]

## 2017-06-12 NOTE — ED Triage Notes (Signed)
Pt reports lower back pain x 2 weeks, worse since Thursday.

## 2017-06-12 NOTE — Discharge Instructions (Signed)
Your exam and x-rays are essentially normal at this time. Take the prescription meds as directed. Apply ice or moist heat compresses to reduce pain. Follow-up with your provider or return to the ED for worsening symptoms.

## 2017-07-08 ENCOUNTER — Other Ambulatory Visit: Payer: Self-pay | Admitting: Internal Medicine

## 2017-07-08 DIAGNOSIS — M545 Low back pain: Secondary | ICD-10-CM

## 2017-07-20 ENCOUNTER — Ambulatory Visit
Admission: RE | Admit: 2017-07-20 | Discharge: 2017-07-20 | Disposition: A | Discharge status: Home / Self Care | Payer: 59 | Source: Ambulatory Visit | Attending: Internal Medicine | Admitting: Internal Medicine

## 2017-07-20 DIAGNOSIS — M5126 Other intervertebral disc displacement, lumbar region: Secondary | ICD-10-CM | POA: Diagnosis not present

## 2017-07-20 DIAGNOSIS — M48061 Spinal stenosis, lumbar region without neurogenic claudication: Secondary | ICD-10-CM | POA: Insufficient documentation

## 2017-07-20 DIAGNOSIS — M5136 Other intervertebral disc degeneration, lumbar region: Secondary | ICD-10-CM | POA: Insufficient documentation

## 2017-07-20 DIAGNOSIS — M545 Low back pain: Secondary | ICD-10-CM | POA: Diagnosis present

## 2017-08-05 ENCOUNTER — Encounter: Payer: Self-pay | Admitting: Gastroenterology

## 2018-12-27 ENCOUNTER — Other Ambulatory Visit: Payer: Self-pay

## 2018-12-27 ENCOUNTER — Ambulatory Visit: Payer: Managed Care, Other (non HMO) | Admitting: Physician Assistant

## 2018-12-27 ENCOUNTER — Encounter: Payer: Self-pay | Admitting: Physician Assistant

## 2018-12-27 VITALS — BP 133/87 | HR 77 | Temp 97.9°F | Resp 16 | Ht 72.0 in | Wt 244.0 lb

## 2018-12-27 DIAGNOSIS — Z720 Tobacco use: Secondary | ICD-10-CM | POA: Diagnosis not present

## 2018-12-27 DIAGNOSIS — Z114 Encounter for screening for human immunodeficiency virus [HIV]: Secondary | ICD-10-CM

## 2018-12-27 DIAGNOSIS — E669 Obesity, unspecified: Secondary | ICD-10-CM

## 2018-12-27 DIAGNOSIS — K219 Gastro-esophageal reflux disease without esophagitis: Secondary | ICD-10-CM

## 2018-12-27 DIAGNOSIS — E785 Hyperlipidemia, unspecified: Secondary | ICD-10-CM

## 2018-12-27 DIAGNOSIS — R7303 Prediabetes: Secondary | ICD-10-CM

## 2018-12-27 DIAGNOSIS — Z130 Encounter for screening for diseases of the blood and blood-forming organs and certain disorders involving the immune mechanism: Secondary | ICD-10-CM

## 2018-12-27 DIAGNOSIS — Z23 Encounter for immunization: Secondary | ICD-10-CM | POA: Diagnosis not present

## 2018-12-27 MED ORDER — OMEPRAZOLE 40 MG PO CPDR: 40.0000 mg | DELAYED_RELEASE_CAPSULE | Freq: Every day | ORAL | 0 refills | Status: DC

## 2018-12-27 MED ORDER — BUPROPION HCL ER (SR) 150 MG PO TB12: ORAL_TABLET | ORAL | 0 refills | Status: DC

## 2018-12-27 NOTE — Progress Notes (Signed)
Patient: Todd Massey, Male    DOB: 1971/09/18, 48 y.o.   MRN: [id number] Visit Date: 12/27/2018  Today's Provider: Trinna Post, PA-C   Chief Complaint  Patient presents with  . New Patient (Initial Visit)   Subjective:     Annual physical exam Todd Massey is a 48 y.o. male who presents today for health maintenance and complete physical. He feels well. He reports exercising none. He reports he is sleeping well. 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 Lives with fiance, two sons, and mother. Patient needs refill on GERD medication.  Tobacco Abuse: 1 pack a day for 30+ years. Not interested in quitting. Quit for one year before.   HLD: Made him feel "weird," felt nauseated. Took it for 6 months. Lipitor 20 mg daily previously.   Wt Readings from Last 3 Encounters:  12/27/18 244 lb (110.7 kg)  03/31/16 243 lb (110.2 kg)  12/26/15 242 lb (109.8 kg)   Prediabetes: Previously was   Breakfast: [employer] biscuit - soda - 32 ounces coke Lunch: [employer] - 4 for 4 nuggets, double stack, fries, and drink 1-2 ounces 12 ounces  -----------------------------------------------------------------   Review of Systems  Constitutional: Negative.   HENT: Positive for dental problem, hearing loss and tinnitus.   Eyes: Negative.   Respiratory: Negative.   Cardiovascular: Negative.   Gastrointestinal: Positive for abdominal pain.  Endocrine: Negative.   Genitourinary: Negative.   Musculoskeletal: Negative.   Skin: Negative.   Allergic/Immunologic: Positive for environmental allergies.  Neurological: Positive for dizziness, light-headedness, numbness and headaches.  Hematological: Negative.   Psychiatric/Behavioral: Positive for agitation.    Social History      He  reports that he has been smoking cigarettes. He has a 15.00 pack-year smoking history. He has never used smokeless tobacco. He reports that he does not drink alcohol or use drugs.         Social History   Socioeconomic History  . Marital status: Single    Spouse name: Not on file  . Number of children: Not on file  . Years of education: Not on file  . Highest education level: Not on file  Occupational History  . Not on file  Social Needs  . Financial resource strain: Not on file  . Food insecurity:    Worry: Not on file    Inability: Not on file  . Transportation needs:    Medical: Not on file    Non-medical: Not on file  Tobacco Use  . Smoking status: Current Every Day Smoker    Packs/day: 0.50    Years: 30.00    Pack years: 15.00    Types: Cigarettes  . Smokeless tobacco: Never Used  Substance and Sexual Activity  . Alcohol use: No  . Drug use: No  . Sexual activity: Not on file  Lifestyle  . Physical activity:    Days per week: Not on file    Minutes per session: Not on file  . Stress: Not on file  Relationships  . Social connections:    Talks on phone: Not on file    Gets together: Not on file    Attends religious service: Not on file    Active member of club or organization: Not on file    Attends meetings of clubs or organizations: Not on file    Relationship status: Not on file  Other Topics Concern  . Not on file  Social History  Narrative  . Not on file    Past Medical History:  Diagnosis Date  . Allergy   . GERD (gastroesophageal reflux disease)   . Headache    migraines     There are no active problems to display for this patient.   Past Surgical History:  Procedure Laterality Date  . APPENDECTOMY    . EUS N/A 03/31/2016   Procedure: ESOPHAGEAL ENDOSCOPIC ULTRASOUND (EUS) RADIAL;  Surgeon: Arta Silence, MD;  Location: [facility] ENDOS[facility];  Service: Endoscopy;  Laterality: N/A;    Family History        Family Status  Relation Name Status  . Mother  Deceased        His family history includes Diabetes in his mother.      Allergies  Allergen Reactions  . Penicillins Rash and Other (See Comments)    Has patient had a PCN  reaction causing immediate rash, facial/tongue/throat swelling, SOB or lightheadedness with hypotension: yes Has patient had a PCN reaction causing severe rash involving mucus membranes or skin necrosis: no Has patient had a PCN reaction that required hospitalization no Has patient had a PCN reaction occurring within the last 10 years: no If all of the above answers are "NO", then may proceed with Cephalosporin use.      Current Outpatient Medications:  .  ibuprofen (ADVIL,MOTRIN) 200 MG tablet, Take 400 mg by mouth every 6 (six) hours as needed (For headache or body aches.)., Disp: , Rfl:  .  omeprazole (PRILOSEC) 40 MG capsule, Take 40 mg by mouth daily., Disp: , Rfl:    Patient Care Team: Paulene Floor as PCP - General (Physician Assistant)    Objective:    Vitals: BP 133/87 (BP Location: Right Arm, Patient Position: Sitting, Cuff Size: Normal)   Pulse 77   Temp 97.9 F (36.6 C) (Oral)   Resp 16   Ht 6' (1.829 m)   Wt 244 lb (110.7 kg)   BMI 33.09 kg/m    Vitals:   12/27/18 1418  BP: 133/87  Pulse: 77  Resp: 16  Temp: 97.9 F (36.6 C)  TempSrc: Oral  Weight: 244 lb (110.7 kg)  Height: 6' (1.829 m)     Physical Exam Constitutional:      Appearance: Normal appearance. He is obese.  HENT:     Right Ear: Tympanic membrane and ear canal normal.     Left Ear: Tympanic membrane and ear canal normal.  Cardiovascular:     Rate and Rhythm: Normal rate and regular rhythm.     Heart sounds: Normal heart sounds.  Pulmonary:     Effort: Pulmonary effort is normal.  Skin:    General: Skin is warm and dry.  Neurological:     Mental Status: He is alert and oriented to person, place, and time. Mental status is at baseline.  Psychiatric:        Mood and Affect: Mood normal.        Behavior: Behavior normal.      Depression Screen PHQ 2/9 Scores 12/27/2018  PHQ - 2 Score 1  PHQ- 9 Score 4       Assessment & Plan:     Routine Health Maintenance and  Physical Exam  Exercise Activities and Dietary recommendations Goals   None      There is no immunization history on file for this patient.  Health Maintenance  Topic Date Due  . HIV Screening  12/24/1985  . TETANUS/TDAP  12/24/1989  .  INFLUENZA VACCINE  06/22/2018     Discussed health benefits of physical activity, and encouraged him to engage in regular exercise appropriate for his age and condition.    1. Hyperlipidemia, unspecified hyperlipidemia type  Lipid profile extremely deranged with LDL > 200. Restarted lipitor 10 mg. Reports a history of elevated liver enzymes with extensive workup.  - Lipid Profile  2. Tobacco abuse  I have counseled >3 min on tobacco cessation and will start wellbutrin as below.  - buPROPion (WELLBUTRIN SR) 150 MG 12 hr tablet; Take one pill in the morning for three days. Then take one pill twice daily onwards.  Dispense: 90 tablet; Refill: 0  3. Gastroesophageal reflux disease, esophagitis presence not specified   - omeprazole (PRILOSEC) 40 MG capsule; Take 1 capsule (40 mg total) by mouth daily.  Dispense: 90 capsule; Refill: 0  4. Prediabetes  Remains prediabetic, consider metformin at next visit.   - Comprehensive Metabolic Panel (CMET) - HgB A1c  5. Obesity with serious comorbidity, unspecified classification, unspecified obesity type  - TSH  6. Encounter for screening for HIV  - HIV antibody (with reflex)  7. Screening for deficiency anemia  - CBC with Differential  8. Need for Tdap vaccination  - Tdap vaccine greater than or equal to 7yo IM  The entirety of the information documented in the History of Present Illness, Review of Systems and Physical Exam were personally obtained by me. Portions of this information were initially documented by Lynford Humphrey, CMA and reviewed by me for thoroughness and accuracy.   No follow-ups on  file.  --------------------------------------------------------------------    Trinna Post, PA-C  [place]

## 2018-12-28 LAB — CBC WITH DIFFERENTIAL/PLATELET
Basophils Absolute: 0.2 10*3/uL (ref 0.0–0.2)
Basos: 2 %
EOS (ABSOLUTE): 0.4 10*3/uL (ref 0.0–0.4)
Eos: 4 %
Hematocrit: 47.7 % (ref 37.5–51.0)
Hemoglobin: 16.6 g/dL (ref 13.0–17.7)
Immature Grans (Abs): 0 10*3/uL (ref 0.0–0.1)
Immature Granulocytes: 0 %
Lymphocytes Absolute: 3.7 10*3/uL — ABNORMAL HIGH (ref 0.7–3.1)
Lymphs: 38 %
MCH: 29.9 pg (ref 26.6–33.0)
MCHC: 34.8 g/dL (ref 31.5–35.7)
MCV: 86 fL (ref 79–97)
Monocytes Absolute: 0.8 10*3/uL (ref 0.1–0.9)
Monocytes: 9 %
Neutrophils Absolute: 4.4 10*3/uL (ref 1.4–7.0)
Neutrophils: 47 %
Platelets: 208 10*3/uL (ref 150–450)
RBC: 5.56 x10E6/uL (ref 4.14–5.80)
RDW: 13.7 % (ref 11.6–15.4)
WBC: 9.6 10*3/uL (ref 3.4–10.8)

## 2018-12-28 LAB — COMPREHENSIVE METABOLIC PANEL
ALT: 81 IU/L — ABNORMAL HIGH (ref 0–44)
AST: 45 IU/L — ABNORMAL HIGH (ref 0–40)
Albumin/Globulin Ratio: 1.6 (ref 1.2–2.2)
Albumin: 4.5 g/dL (ref 4.0–5.0)
Alkaline Phosphatase: 228 IU/L — ABNORMAL HIGH (ref 39–117)
BUN/Creatinine Ratio: 15 (ref 9–20)
BUN: 16 mg/dL (ref 6–24)
Bilirubin Total: 0.5 mg/dL (ref 0.0–1.2)
CO2: 21 mmol/L (ref 20–29)
Calcium: 9.5 mg/dL (ref 8.7–10.2)
Chloride: 102 mmol/L (ref 96–106)
Creatinine, Ser: 1.07 mg/dL (ref 0.76–1.27)
GFR calc Af Amer: 94 mL/min/{1.73_m2} (ref >59)
GFR calc non Af Amer: 82 mL/min/{1.73_m2} (ref >59)
Globulin, Total: 2.9 g/dL (ref 1.5–4.5)
Glucose: 64 mg/dL — ABNORMAL LOW (ref 65–99)
Potassium: 4.6 mmol/L (ref 3.5–5.2)
Sodium: 139 mmol/L (ref 134–144)
Total Protein: 7.4 g/dL (ref 6.0–8.5)

## 2018-12-28 LAB — HEMOGLOBIN A1C
Est. average glucose Bld gHb Est-mCnc: 123 mg/dL
Hgb A1c MFr Bld: 5.9 % — ABNORMAL HIGH (ref 4.8–5.6)

## 2018-12-28 LAB — LIPID PANEL
Chol/HDL Ratio: 8.4 ratio — ABNORMAL HIGH (ref 0.0–5.0)
Cholesterol, Total: 336 mg/dL — ABNORMAL HIGH (ref 100–199)
HDL: 40 mg/dL (ref >39)
LDL Calculated: 226 mg/dL — ABNORMAL HIGH (ref 0–99)
Triglycerides: 349 mg/dL — ABNORMAL HIGH (ref 0–149)
VLDL Cholesterol Cal: 70 mg/dL — ABNORMAL HIGH (ref 5–40)

## 2018-12-28 LAB — HIV ANTIBODY (ROUTINE TESTING W REFLEX): HIV Screen 4th Generation wRfx: NONREACTIVE

## 2018-12-28 LAB — TSH: TSH: 1.78 u[IU]/mL (ref 0.450–4.500)

## 2019-01-06 ENCOUNTER — Telehealth: Payer: Self-pay

## 2019-01-06 NOTE — Telephone Encounter (Signed)
lmtcb

## 2019-01-06 NOTE — Telephone Encounter (Signed)
-----   Message from Trinna Post, [place] sent at 01/03/2019  2:20 PM EST ----- HIV negative. His cholesterol is VERY HIGH. He is high risk of having a heart attack or stroke. I am going to send in 10 mg Lipitor to take nightly. He talked about being willing to take this medication at our office visit at a low dose. His liver enzymes are slightly elevated but he has been worked up for this. He is prediabetic but I am going to hold off on metformin because I just want him to focus on the lipitor or cholesterol medication. If he does not have OV schedule please do so for followup.

## 2019-01-09 NOTE — Telephone Encounter (Signed)
lmtcb

## 2019-01-10 ENCOUNTER — Encounter: Payer: Self-pay | Admitting: Physician Assistant

## 2019-01-11 ENCOUNTER — Other Ambulatory Visit: Payer: Self-pay | Admitting: Physician Assistant

## 2019-01-11 DIAGNOSIS — E785 Hyperlipidemia, unspecified: Secondary | ICD-10-CM

## 2019-01-11 MED ORDER — ROSUVASTATIN CALCIUM 10 MG PO TABS
10.0000 mg | ORAL_TABLET | Freq: Every day | ORAL | 3 refills | Status: DC
Start: 2019-01-11 — End: 2019-01-22

## 2019-01-11 NOTE — Progress Notes (Unsigned)
cre 

## 2019-01-11 NOTE — Telephone Encounter (Signed)
Patient has viewed results on mychart. °

## 2019-01-21 ENCOUNTER — Encounter: Payer: Self-pay | Admitting: Physician Assistant

## 2019-01-21 DIAGNOSIS — E785 Hyperlipidemia, unspecified: Secondary | ICD-10-CM

## 2019-01-23 MED ORDER — ROSUVASTATIN CALCIUM 10 MG PO TABS
10.0000 mg | ORAL_TABLET | Freq: Every day | ORAL | 3 refills | Status: DC
Start: 2019-01-23 — End: 2019-03-01

## 2019-01-25 ENCOUNTER — Ambulatory Visit: Payer: Self-pay | Admitting: Physician Assistant

## 2019-01-27 ENCOUNTER — Other Ambulatory Visit: Payer: Self-pay | Admitting: Physician Assistant

## 2019-01-27 DIAGNOSIS — Z720 Tobacco use: Secondary | ICD-10-CM

## 2019-01-28 ENCOUNTER — Encounter: Payer: Self-pay | Admitting: Physician Assistant

## 2019-01-30 ENCOUNTER — Ambulatory Visit: Payer: Self-pay | Admitting: Physician Assistant

## 2019-01-30 ENCOUNTER — Ambulatory Visit: Payer: Managed Care, Other (non HMO) | Admitting: Physician Assistant

## 2019-01-30 ENCOUNTER — Encounter: Payer: Self-pay | Admitting: Physician Assistant

## 2019-01-30 VITALS — BP 136/87 | HR 81 | Temp 97.8°F | Wt 240.6 lb

## 2019-01-30 DIAGNOSIS — M545 Low back pain, unspecified: Secondary | ICD-10-CM

## 2019-01-30 LAB — POCT URINALYSIS DIPSTICK
Bilirubin, UA: NEGATIVE
Blood, UA: NEGATIVE
Glucose, UA: NEGATIVE
Ketones, UA: NEGATIVE
Leukocytes, UA: NEGATIVE
Nitrite, UA: NEGATIVE
Protein, UA: NEGATIVE
Spec Grav, UA: 1.015 (ref 1.010–1.025)
Urobilinogen, UA: 0.2 E.U./dL
pH, UA: 6 (ref 5.0–8.0)

## 2019-01-30 MED ORDER — MELOXICAM 7.5 MG PO TABS: 7.5000 mg | ORAL_TABLET | Freq: Every day | ORAL | 0 refills | Status: DC

## 2019-01-30 NOTE — Progress Notes (Signed)
Patient: Todd Massey Male    DOB: July 08, 1971   48 y.o.   MRN: [id number] Visit Date: 01/30/2019  Today's Provider: Trinna Post, PA-C   Chief Complaint  Patient presents with  . Back Pain   Subjective:    Back Pain  The current episode started more than 1 month ago. The problem occurs intermittently. The problem has been gradually worsening since onset. Pain location: right lower back per pt. Quality: sharp pain. The pain does not radiate. The pain is at a severity of 6/10. The pain is moderate. The pain is the same all the time. The symptoms are aggravated by bending, lying down, position, sitting, standing and twisting. Stiffness is present all day. Associated symptoms include headaches. Pertinent negatives include no abdominal pain or leg pain. He has tried NSAIDs for the symptoms. The treatment provided no relief.   Patient works doing frequent heavy lifting.    Allergies  Allergen Reactions  . Penicillins Rash and Other (See Comments)    Has patient had a PCN reaction causing immediate rash, facial/tongue/throat swelling, SOB or lightheadedness with hypotension: yes Has patient had a PCN reaction causing severe rash involving mucus membranes or skin necrosis: no Has patient had a PCN reaction that required hospitalization no Has patient had a PCN reaction occurring within the last 10 years: no If all of the above answers are "NO", then may proceed with Cephalosporin use.      Current Outpatient Medications:  .  buPROPion (WELLBUTRIN SR) 150 MG 12 hr tablet, Take one pill in the morning for three days. Then take one pill twice daily onwards., Disp: 90 tablet, Rfl: 0 .  ibuprofen (ADVIL,MOTRIN) 200 MG tablet, Take 400 mg by mouth every 6 (six) hours as needed (For headache or body aches.)., Disp: , Rfl:  .  omeprazole (PRILOSEC) 40 MG capsule, Take 1 capsule (40 mg total) by mouth daily., Disp: 90 capsule, Rfl: 0 .  rosuvastatin (CRESTOR) 10 MG tablet, Take 1 tablet  (10 mg total) by mouth daily., Disp: 90 tablet, Rfl: 3  Review of Systems  Gastrointestinal: Negative for abdominal pain.  Musculoskeletal: Positive for back pain.  Neurological: Positive for headaches.    Social History   Tobacco Use  . Smoking status: Current Every Day Smoker    Packs/day: 0.50    Years: 30.00    Pack years: 15.00    Types: Cigarettes  . Smokeless tobacco: Never Used  Substance Use Topics  . Alcohol use: No      Objective:   BP 136/87 (BP Location: Left Arm, Patient Position: Sitting, Cuff Size: Large)   Pulse 81   Temp 97.8 F (36.6 C) (Oral)   Wt 240 lb 9.6 oz (109.1 kg)   SpO2 96%   BMI 32.63 kg/m  Vitals:   01/30/19 1224  BP: 136/87  Pulse: 81  Temp: 97.8 F (36.6 C)  TempSrc: Oral  SpO2: 96%  Weight: 240 lb 9.6 oz (109.1 kg)     Physical Exam Constitutional:      Appearance: Normal appearance.  Cardiovascular:     Rate and Rhythm: Normal rate and regular rhythm.     Heart sounds: Normal heart sounds.  Pulmonary:     Effort: Pulmonary effort is normal.     Breath sounds: Normal breath sounds.  Abdominal:     General: Abdomen is flat. Bowel sounds are normal.     Palpations: Abdomen is soft.     Tenderness: There  is no abdominal tenderness. There is no right CVA tenderness or left CVA tenderness.  Musculoskeletal:        General: Tenderness present.       Arms:  Skin:    General: Skin is warm and dry.  Neurological:     Mental Status: He is alert and oriented to person, place, and time. Mental status is at baseline.  Psychiatric:        Mood and Affect: Mood normal.        Behavior: Behavior normal.         Assessment & Plan    1. Right low back pain, unspecified chronicity, unspecified whether sciatica present  Urinalysis normal. Patient extremely point tender along right paraspinal muscle. Explained to patient that due to tenderness, exacerbation with  Movement, lack of urinary findings or systemic infectious  signs/symptoms, this is most likely muscle strain. May use anti inflammatory and flexeril. Counseled may take several weeks to heal, can use warm heat or ice.   - POCT urinalysis dipstick - meloxicam (MOBIC) 7.5 MG tablet; Take 1 tablet (7.5 mg total) by mouth daily.  Dispense: 30 tablet; Refill: 0  The entirety of the information documented in the History of Present Illness, Review of Systems and Physical Exam were personally obtained by me. Portions of this information were initially documented by [facility], CMA and reviewed by me for thoroughness and accuracy.         Trinna Post, PA-C  [facility]

## 2019-01-30 NOTE — Patient Instructions (Signed)
Acute Back Pain, Adult  Acute back pain is sudden and usually short-lived. It is often caused by an injury to the muscles and tissues in the back. The injury may result from:   A muscle or ligament getting overstretched or torn (strained). Ligaments are tissues that connect bones to each other. Lifting something improperly can cause a back strain.   Wear and tear (degeneration) of the spinal disks. Spinal disks are circular tissue that provides cushioning between the bones of the spine (vertebrae).   Twisting motions, such as while playing sports or doing yard work.   A hit to the back.   Arthritis.  You may have a physical exam, lab tests, and imaging tests to find the cause of your pain. Acute back pain usually goes away with rest and home care.  Follow these instructions at home:  Managing pain, stiffness, and swelling   Take over-the-counter and prescription medicines only as told by your health care provider.   Your health care provider may recommend applying ice during the first 24-48 hours after your pain starts. To do this:  ? Put ice in a plastic bag.  ? Place a towel between your skin and the bag.  ? Leave the ice on for 20 minutes, 2-3 times a day.   If directed, apply heat to the affected area as often as told by your health care provider. Use the heat source that your health care provider recommends, such as a moist heat pack or a heating pad.  ? Place a towel between your skin and the heat source.  ? Leave the heat on for 20-30 minutes.  ? Remove the heat if your skin turns bright red. This is especially important if you are unable to feel pain, heat, or cold. You have a greater risk of getting burned.  Activity     Do not stay in bed. Staying in bed for more than 1-2 days can delay your recovery.   Sit up and stand up straight. Avoid leaning forward when you sit, or hunching over when you stand.  ? If you work at a desk, sit close to it so you do not need to lean over. Keep your chin tucked  in. Keep your neck drawn back, and keep your elbows bent at a right angle. Your arms should look like the letter "L."  ? Sit high and close to the steering wheel when you drive. Add lower back (lumbar) support to your car seat, if needed.   Take short walks on even surfaces as soon as you are able. Try to increase the length of time you walk each day.   Do not sit, drive, or stand in one place for more than 30 minutes at a time. Sitting or standing for long periods of time can put stress on your back.   Do not drive or use heavy machinery while taking prescription pain medicine.   Use proper lifting techniques. When you bend and lift, use positions that put less stress on your back:  ? Bend your knees.  ? Keep the load close to your body.  ? Avoid twisting.   Exercise regularly as told by your health care provider. Exercising helps your back heal faster and helps prevent back injuries by keeping muscles strong and flexible.   Work with a physical therapist to make a safe exercise program, as recommended by your health care provider. Do any exercises as told by your physical therapist.  Lifestyle   Maintain   a healthy weight. Extra weight puts stress on your back and makes it difficult to have good posture.   Avoid activities or situations that make you feel anxious or stressed. Stress and anxiety increase muscle tension and can make back pain worse. Learn ways to manage anxiety and stress, such as through exercise.  General instructions   Sleep on a firm mattress in a comfortable position. Try lying on your side with your knees slightly bent. If you lie on your back, put a pillow under your knees.   Follow your treatment plan as told by your health care provider. This may include:  ? Cognitive or behavioral therapy.  ? Acupuncture or massage therapy.  ? Meditation or yoga.  Contact a health care provider if:   You have pain that is not relieved with rest or medicine.   You have increasing pain going down  into your legs or buttocks.   Your pain does not improve after 2 weeks.   You have pain at night.   You lose weight without trying.   You have a fever or chills.  Get help right away if:   You develop new bowel or bladder control problems.   You have unusual weakness or numbness in your arms or legs.   You develop nausea or vomiting.   You develop abdominal pain.   You feel faint.  Summary   Acute back pain is sudden and usually short-lived.   Use proper lifting techniques. When you bend and lift, use positions that put less stress on your back.   Take over-the-counter and prescription medicines and apply heat or ice as directed by your health care provider.  This information is not intended to replace advice given to you by your health care provider. Make sure you discuss any questions you have with your health care provider.  Document Released: 11/08/2005 Document Revised: 06/15/2018 Document Reviewed: 06/22/2017  Elsevier Interactive Patient Education  2019 [employer]vier Inc[employer]

## 2019-02-19 ENCOUNTER — Observation Stay: Payer: Managed Care, Other (non HMO)

## 2019-02-19 ENCOUNTER — Other Ambulatory Visit: Payer: Self-pay

## 2019-02-19 ENCOUNTER — Observation Stay
Admission: EM | Admit: 2019-02-19 | Discharge: 2019-02-20 | Disposition: A | Discharge status: Home / Self Care | Payer: Managed Care, Other (non HMO) | Attending: Internal Medicine | Admitting: Internal Medicine

## 2019-02-19 ENCOUNTER — Telehealth: Payer: Self-pay

## 2019-02-19 ENCOUNTER — Encounter: Payer: Self-pay | Admitting: Emergency Medicine

## 2019-02-19 ENCOUNTER — Observation Stay
Admit: 2019-02-19 | Discharge: 2019-02-19 | Disposition: A | Discharge status: Home / Self Care | Payer: Managed Care, Other (non HMO) | Attending: Internal Medicine | Admitting: Internal Medicine

## 2019-02-19 ENCOUNTER — Emergency Department: Payer: Managed Care, Other (non HMO)

## 2019-02-19 DIAGNOSIS — Z79899 Other long term (current) drug therapy: Secondary | ICD-10-CM | POA: Diagnosis not present

## 2019-02-19 DIAGNOSIS — K219 Gastro-esophageal reflux disease without esophagitis: Secondary | ICD-10-CM | POA: Insufficient documentation

## 2019-02-19 DIAGNOSIS — R748 Abnormal levels of other serum enzymes: Secondary | ICD-10-CM

## 2019-02-19 DIAGNOSIS — E785 Hyperlipidemia, unspecified: Secondary | ICD-10-CM | POA: Diagnosis not present

## 2019-02-19 DIAGNOSIS — Z791 Long term (current) use of non-steroidal anti-inflammatories (NSAID): Secondary | ICD-10-CM | POA: Insufficient documentation

## 2019-02-19 DIAGNOSIS — F17210 Nicotine dependence, cigarettes, uncomplicated: Secondary | ICD-10-CM | POA: Insufficient documentation

## 2019-02-19 DIAGNOSIS — K760 Fatty (change of) liver, not elsewhere classified: Secondary | ICD-10-CM | POA: Insufficient documentation

## 2019-02-19 DIAGNOSIS — R200 Anesthesia of skin: Principal | ICD-10-CM | POA: Diagnosis present

## 2019-02-19 DIAGNOSIS — G43109 Migraine with aura, not intractable, without status migrainosus: Secondary | ICD-10-CM | POA: Diagnosis not present

## 2019-02-19 LAB — DIFFERENTIAL
Abs Immature Granulocytes: 0.03 10*3/uL (ref 0.00–0.07)
Basophils Absolute: 0.1 10*3/uL (ref 0.0–0.1)
Basophils Relative: 1 %
Eosinophils Absolute: 0.3 10*3/uL (ref 0.0–0.5)
Eosinophils Relative: 3 %
Immature Granulocytes: 0 %
Lymphocytes Relative: 32 %
Lymphs Abs: 3.8 10*3/uL (ref 0.7–4.0)
Monocytes Absolute: 0.7 10*3/uL (ref 0.1–1.0)
Monocytes Relative: 6 %
NEUTROS ABS: 6.8 10*3/uL (ref 1.7–7.7)
Neutrophils Relative %: 58 %

## 2019-02-19 LAB — CBC
HCT: 48.4 % (ref 39.0–52.0)
HEMOGLOBIN: 16.3 g/dL (ref 13.0–17.0)
MCH: 29.9 pg (ref 26.0–34.0)
MCHC: 33.7 g/dL (ref 30.0–36.0)
MCV: 88.8 fL (ref 80.0–100.0)
Platelets: 188 10*3/uL (ref 150–400)
RBC: 5.45 MIL/uL (ref 4.22–5.81)
RDW: 13.5 % (ref 11.5–15.5)
WBC: 11.8 10*3/uL — ABNORMAL HIGH (ref 4.0–10.5)
nRBC: 0 % (ref 0.0–0.2)

## 2019-02-19 LAB — COMPREHENSIVE METABOLIC PANEL
ALT: 59 U/L — ABNORMAL HIGH (ref 0–44)
AST: 38 U/L (ref 15–41)
Albumin: 4.4 g/dL (ref 3.5–5.0)
Alkaline Phosphatase: 141 U/L — ABNORMAL HIGH (ref 38–126)
Anion gap: 9 (ref 5–15)
BUN: 18 mg/dL (ref 6–20)
CHLORIDE: 106 mmol/L (ref 98–111)
CO2: 22 mmol/L (ref 22–32)
CREATININE: 1.18 mg/dL (ref 0.61–1.24)
Calcium: 9.2 mg/dL (ref 8.9–10.3)
GFR calc Af Amer: >60 mL/min (ref >60)
GFR calc non Af Amer: >60 mL/min (ref >60)
Glucose, Bld: 107 mg/dL — ABNORMAL HIGH (ref 70–99)
Potassium: 4 mmol/L (ref 3.5–5.1)
Sodium: 137 mmol/L (ref 135–145)
Total Bilirubin: 0.8 mg/dL (ref 0.3–1.2)
Total Protein: 7.8 g/dL (ref 6.5–8.1)

## 2019-02-19 LAB — GAMMA GT: GGT: 340 U/L — ABNORMAL HIGH (ref 7–50)

## 2019-02-19 LAB — GLUCOSE, CAPILLARY: GLUCOSE-CAPILLARY: 137 mg/dL — AB (ref 70–99)

## 2019-02-19 LAB — SEDIMENTATION RATE: Sed Rate: 3 mm/hr (ref 0–15)

## 2019-02-19 LAB — APTT: aPTT: 26 seconds (ref 24–36)

## 2019-02-19 LAB — PROTIME-INR
INR: 0.9 (ref 0.8–1.2)
Prothrombin Time: 12 seconds (ref 11.4–15.2)

## 2019-02-19 LAB — C-REACTIVE PROTEIN: CRP: 0.9 mg/dL (ref <1.0)

## 2019-02-19 MED ORDER — ACETAMINOPHEN 160 MG/5ML PO SOLN
650.0000 mg | ORAL | Status: DC | PRN
  Filled 2019-02-19: qty 20.3

## 2019-02-19 MED ORDER — ENOXAPARIN SODIUM 40 MG/0.4ML ~~LOC~~ SOLN: 40.0000 mg | SUBCUTANEOUS | Status: DC

## 2019-02-19 MED ORDER — SODIUM CHLORIDE 0.9% FLUSH
3.0000 mL | Freq: Once | INTRAVENOUS | Status: DC
Start: 2019-02-19 — End: 2019-02-19
  Administered 2019-02-19: 3 mL via INTRAVENOUS

## 2019-02-19 MED ORDER — ATORVASTATIN CALCIUM 20 MG PO TABS
40.0000 mg | ORAL_TABLET | Freq: Every day | ORAL | Status: DC
  Administered 2019-02-19: 17:00:00 40 mg via ORAL
  Filled 2019-02-19: qty 2

## 2019-02-19 MED ORDER — STROKE: EARLY STAGES OF RECOVERY BOOK
Freq: Once | Status: AC
  Administered 2019-02-19: 13:00:00

## 2019-02-19 MED ORDER — ACETAMINOPHEN 325 MG PO TABS: 650.0000 mg | ORAL_TABLET | ORAL | Status: DC | PRN

## 2019-02-19 MED ORDER — BUPROPION HCL ER (SR) 150 MG PO TB12
150.0000 mg | ORAL_TABLET | Freq: Two times a day (BID) | ORAL | Status: DC
  Filled 2019-02-19 (×4): qty 1

## 2019-02-19 MED ORDER — ASPIRIN 81 MG PO CHEW
324.0000 mg | CHEWABLE_TABLET | Freq: Once | ORAL | Status: AC
  Administered 2019-02-19: 324 mg via ORAL
  Filled 2019-02-19: qty 4

## 2019-02-19 MED ORDER — ACETAMINOPHEN 650 MG RE SUPP: 650.0000 mg | RECTAL | Status: DC | PRN

## 2019-02-19 MED ORDER — PANTOPRAZOLE SODIUM 40 MG PO TBEC
40.0000 mg | DELAYED_RELEASE_TABLET | Freq: Every day | ORAL | Status: DC
  Administered 2019-02-20: 40 mg via ORAL
  Filled 2019-02-19: qty 1

## 2019-02-19 MED ORDER — ASPIRIN 81 MG PO CHEW
81.0000 mg | CHEWABLE_TABLET | Freq: Every day | ORAL | Status: DC
  Administered 2019-02-20: 81 mg via ORAL
  Filled 2019-02-19: qty 1

## 2019-02-19 MED ORDER — SENNOSIDES-DOCUSATE SODIUM 8.6-50 MG PO TABS: 1.0000 | ORAL_TABLET | Freq: Every evening | ORAL | Status: DC | PRN

## 2019-02-19 NOTE — ED Notes (Signed)
First Nurse Note: Patient states he was fine when he woke up.  Sx onset approx 30 before arrival.  Dr. Jimmye Norman consulted, Code Stroke called.

## 2019-02-19 NOTE — ED Notes (Signed)
Code  Stroke  Called  To 333 

## 2019-02-19 NOTE — H&P (Signed)
[place] [facility] [place] NAME: Todd Massey    MR#:  [id number]  DATE OF BIRTH:  1971-05-26  DATE OF ADMISSION:  02/19/2019  PRIMARY CARE PHYSICIAN: Trinna Post, PA-C   REQUESTING/REFERRING PHYSICIAN: Lenise Arena, MD  CHIEF COMPLAINT:   Chief Complaint  Patient presents with  . Numbness    HISTORY OF PRESENT ILLNESS:  Todd Massey  is a 48 y.o. male with a known history of migraines and GERD who presented to the ED with left arm numbness for the last couple of hours.  He states that the numbness started out of nowhere.  His numbness has completely resolved.  He denies any weakness or tingling in his left upper extremity.  He denies any numbness in any of his lower extremities.  No visual changes or recent headaches.  He states that he developed left arm numbness a couple of months ago.  The numbness lasted for 5 to 10 minutes and resolved on its own.  In the ED, vitals were unremarkable.  Labs were significant for mildly elevated alk phos and ALT.  Code stroke was called.  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  Hospitalists were called for admission.  PAST MEDICAL HISTORY:   Past Medical History:  Diagnosis Date  . Allergy   . GERD (gastroesophageal reflux disease)   . Headache    migraines    PAST SURGICAL HISTORY:   Past Surgical History:  Procedure Laterality Date  . APPENDECTOMY    . EUS N/A 03/31/2016   Procedure: ESOPHAGEAL ENDOSCOPIC ULTRASOUND (EUS) RADIAL;  Surgeon: Arta Silence, MD;  Location: [facility];  Service: Endoscopy;  Laterality: N/A;    SOCIAL HISTORY:   Social History   Tobacco Use  . Smoking status: Current Every Day Smoker    Packs/day: 0.50    Years: 30.00    Pack years: 15.00    Types: Cigarettes  . Smokeless tobacco: Never Used  Substance Use Topics  . Alcohol use: No    FAMILY HISTORY:   Family History  Problem  Relation Age of Onset  . Diabetes Mother     DRUG ALLERGIES:   Allergies  Allergen Reactions  . Penicillins Rash and Other (See Comments)    Has patient had a PCN reaction causing immediate rash, facial/tongue/throat swelling, SOB or lightheadedness with hypotension: yes Has patient had a PCN reaction causing severe rash involving mucus membranes or skin necrosis: no Has patient had a PCN reaction that required hospitalization no Has patient had a PCN reaction occurring within the last 10 years: no If all of the above answers are "NO", then may proceed with Cephalosporin use.     REVIEW OF SYSTEMS:   Review of Systems  Constitutional: Negative for chills and fever.  HENT: Negative for congestion and sore throat.   Eyes: Negative for blurred vision and double vision.  Respiratory: Negative for cough and shortness of breath.   Cardiovascular: Negative for chest pain and palpitations.  Gastrointestinal: Negative for nausea and vomiting.  Genitourinary: Negative for dysuria and urgency.  Musculoskeletal: Negative for neck pain.  Neurological: Positive for sensory change. Negative for dizziness, tingling, focal weakness and headaches.  Psychiatric/Behavioral: Negative for depression. The patient is not nervous/anxious.     MEDICATIONS AT HOME:   Prior to Admission medications   Medication Sig Start Date End Date Taking? Authorizing Provider  buPROPion (WELLBUTRIN SR) 150 MG 12 hr  tablet Take 1 tablet (150 mg total) by mouth 2 (two) times daily. 01/31/19 05/01/19  Trinna Post, PA-C  ibuprofen (ADVIL,MOTRIN) 200 MG tablet Take 400 mg by mouth every 6 (six) hours as needed (For headache or body aches.).    [provider]  meloxicam (MOBIC) 7.5 MG tablet Take 1 tablet (7.5 mg total) by mouth daily. 01/30/19   Trinna Post, PA-C  omeprazole (PRILOSEC) 40 MG capsule Take 1 capsule (40 mg total) by mouth daily. 12/27/18 03/27/19  Trinna Post, PA-C  rosuvastatin  (CRESTOR) 10 MG tablet Take 1 tablet (10 mg total) by mouth daily. 01/23/19   Trinna Post, PA-C      VITAL SIGNS:  Blood pressure 119/78, pulse 72, temperature 98.1 F (36.7 C), temperature source Oral, resp. rate 16, height 6' (1.829 m), weight 107 kg, SpO2 97 %.  PHYSICAL EXAMINATION:  Physical Exam  GENERAL:  48 y.o.-year-old patient lying in the bed with no acute distress.  EYES: Pupils equal, round, reactive to light and accommodation. No scleral icterus. Extraocular muscles intact.  HEENT: Head atraumatic, normocephalic. Oropharynx and nasopharynx clear.  NECK:  Supple, no jugular venous distention. No thyroid enlargement, no tenderness.  LUNGS: Normal breath sounds bilaterally, no wheezing, rales,rhonchi or crepitation. No use of accessory muscles of respiration.  CARDIOVASCULAR: RRR, S1, S2 normal. No murmurs, rubs, or gallops.  ABDOMEN: Soft, nontender, nondistended. Bowel sounds present. No organomegaly or mass.  EXTREMITIES: No pedal edema, cyanosis, or clubbing.  NEUROLOGIC: Cranial nerves II through XII are intact. Muscle strength 5/5 in all extremities. Sensation intact throughout all extremities.  Normal finger-to-nose testing bilaterally.  Normal heel-to-shin testing bilaterally. Gait not checked.  PSYCHIATRIC: The patient is alert and oriented x 3.  SKIN: No obvious rash, lesion, or ulcer.   LABORATORY PANEL:   CBC Recent Labs  Lab 02/19/19 1037  WBC 11.8*  HGB 16.3  HCT 48.4  PLT 188   ------------------------------------------------------------------------------------------------------------------  Chemistries  No results for input(s): NA, K, CL, CO2, GLUCOSE, BUN, CREATININE, CALCIUM, MG, AST, ALT, ALKPHOS, BILITOT in the last 168 hours.  Invalid input(s): GFRCGP ------------------------------------------------------------------------------------------------------------------  Cardiac Enzymes No results for input(s): TROPONINI in the last 168 hours.  ------------------------------------------------------------------------------------------------------------------  RADIOLOGY:  Ct Head Code Stroke Wo Contrast`  Result Date: 02/19/2019 CLINICAL DATA:  Code stroke. Left arm numbness beginning 30 minutes ago. EXAM: CT HEAD WITHOUT CONTRAST TECHNIQUE: Contiguous axial images were obtained from the base of the skull through the vertex without intravenous contrast. COMPARISON:  None. FINDINGS: Brain: There is no evidence of acute infarct, intracranial hemorrhage, mass, midline shift, or extra-axial fluid collection. The ventricles and sulci are normal. A well-defined subcentimeter hypodensity laterally in the left cerebellar hemisphere is most compatible with a chronic infarct. Vascular: Calcified atherosclerosis at the skull base, advanced for age. No hyperdense vessel. Skull: No fracture or suspicious osseous lesion. Sinuses/Orbits: Mild right posterior ethmoid air cell mucosal thickening. Clear mastoid air cells. Unremarkable orbits. Other: None. ASPECTS [facility] Stroke Program Early CT Score) - Ganglionic level infarction (caudate, lentiform nuclei, internal capsule, insula, M1-M3 cortex): 7 - Supraganglionic infarction (M4-M6 cortex): 3 Total score (0-10 with 10 being normal): 10 IMPRESSION: 1. No evidence of acute intracranial abnormality. 2. ASPECTS is 10. 3. Small chronic cerebellar infarct. These results were called by telephone at the time of interpretation on 02/19/2019 at 10:47 am to Dr. Lenise Arena , who verbally acknowledged these results. Electronically Signed   By: Logan Bores M.D.   On: 02/19/2019 10:48  IMPRESSION AND PLAN:   Left arm numbness- likely TIA, as numbness has resolved in the ED. -MRI brain pending -Neurology consult -Echo and carotid Dopplers -A1c and lipid panel -Start aspirin 19m daily -Change statin to Lipitor 40 mg daily (patient unable to tolerate Crestor 20 mg in the past) -PT/OT consults.  No need for  SLP, as patient is not having any speech or swallowing issues.  Elevated alkaline phosphatase/elevated ALT- has been elevated in the past.  No alcohol use. -Will check hepatitis panel, GGT, RUQ [place] Chronic anxiety-stable -Continue home Wellbutrin  GERD-stable -Continue home PPI  Hyperlipidemia-stable -Check lipid panel -Change statin to Lipitor 40 mg daily (patient has been unable to tolerate 20 mg Crestor in the past due to side effects)  All the records are reviewed and case discussed with ED provider. Management plans discussed with the patient, family and they are in agreement.  CODE STATUS: Full  TOTAL TIME TAKING CARE OF THIS PATIENT: 45 minutes.    KBerna SpareMayo M.D on 02/19/2019 at 11:05 AM  Between 7am to 6pm - Pager - [phone] After 6pm go to [email or web address] - password [email or web address] [place] [facility]  Office  [phone] CC: Primary care physician; PTrinna Post PA-C   Note: This dictation was prepared with Dragon dictation along with smaller phrase technology. Any transcriptional errors that result from this process are unintentional.

## 2019-02-19 NOTE — Telephone Encounter (Signed)
Noted, thanks!

## 2019-02-19 NOTE — Telephone Encounter (Signed)
Patient called with his girlfriend on the line requesting an appointment asap. Patient woke up this morning with numbness and weakness of the left arm. He is also having some headaches. He denies any chest pain or shortness of breath. Patient states he has no feeling in his left arm and he cant lift it. I advised patient that he should go to the ER asap for evaluation to rule out possible stroke. Patient and his girlfriend verbally voiced understanding and agreed to go to the ER. Patient was advised to take 325mg  of ASA if he had it available.

## 2019-02-19 NOTE — Progress Notes (Signed)
PT Cancellation Note  Patient Details Name: Todd Massey MRN: [id number] DOB: 1971/09/27   Cancelled Treatment:    Reason Eval/Treat Not Completed: PT screened, no needs identified, will sign off. PT consult received, chart reviewed, patient reports symptoms completely resolved. Patient noted to be independent with transfers, ambulation in room without AD, modified BERG scored 56/56. Patient reports no questions, concerns, and/or PT needs at this time. Patient reports he is at his baseline.  No acute PT needs identified, nurse notified.   Janna Arch, PT, DPT   02/19/2019, 2:42 PM

## 2019-02-19 NOTE — Progress Notes (Signed)
*  PRELIMINARY RESULTS* Echocardiogram 2D Echocardiogram has been performed.  Todd Massey Todd Massey 02/19/2019, 3:52 PM

## 2019-02-19 NOTE — Consult Note (Addendum)
Referring Physician: Lenise Arena, MD    Chief Complaint: Left hand numbness and tingling sensation  HPI: Todd Massey is an 48 y.o. male with past medical history of coronary artery disease, hypertension, migraine headaches and GERD presenting to the ED today with chief complaints of left hand numbness and tingling sensation since 9:30 am.  Patient reports that he was at work and while trying to lift a box had difficulty and almost dropped it on the floor.  He has had similar episode in the past 2 to 3 months that resolved on its own without residual deficit. Denies associated altered sensorium, speech abnormality, cranial nerve deficit, seizures, focal motor deficits, diplopia, nausea or vomiting, dizziness, syncope or LOC. He however reports having left temporal headache in the past few days prior to this episode. The headache is intermittent, moderate to severe in intensity, located predominantly in the left temporal area. He describes pain as sharp and throbbing without associated vision disturbance. On arrival to the ED, he was afebrile with blood pressure 136/58mm Hg and pulse rate 81 beats/min. There were no focal neurological deficits; he was alert and oriented x4, and he did not demonstrate any memory deficits.  Initial NIH stroke scale 1.  A non-contrast head CT showed no acute infarction, small chronic cerebellar infarct noted. His clinical presentation was not suggestive of large vessel occlusive disease therefore patient was not a candidate for thrombectomy.  He was also not deemed candidate for tPA thrombolytics because of resolved symptoms with no residual disabling symptoms.  Due to his stroke risk factors he will be admitted for further stroke work-up and management.  Date last known well: Date: 02/19/2019 Time last known well: Time: 09:30 tPA Given: No: Resolving non-disabling symptoms.  Past Medical History:  Diagnosis Date  . Allergy   . GERD (gastroesophageal reflux disease)    . Headache    migraines    Past Surgical History:  Procedure Laterality Date  . APPENDECTOMY    . EUS N/A 03/31/2016   Procedure: ESOPHAGEAL ENDOSCOPIC ULTRASOUND (EUS) RADIAL;  Surgeon: Arta Silence, MD;  Location: [facility];  Service: Endoscopy;  Laterality: N/A;    Family History  Problem Relation Age of Onset  . Diabetes Mother    Social History:  reports that he has been smoking cigarettes. He has a 15.00 pack-year smoking history. He has never used smokeless tobacco. He reports that he does not drink alcohol or use drugs.  Allergies:  Allergies  Allergen Reactions  . Penicillins Rash and Other (See Comments)    Has patient had a PCN reaction causing immediate rash, facial/tongue/throat swelling, SOB or lightheadedness with hypotension: yes Has patient had a PCN reaction causing severe rash involving mucus membranes or skin necrosis: no Has patient had a PCN reaction that required hospitalization no Has patient had a PCN reaction occurring within the last 10 years: no If all of the above answers are "NO", then may proceed with Cephalosporin use.     Medications: I have reviewed the patient's current medications. Prior to Admission: (Not in a hospital admission)  Scheduled:  ROS: History obtained from the patient   General ROS: negative for - chills, fatigue, fever, night sweats, weight gain or weight loss Psychological ROS: negative for - behavioral disorder, hallucinations, memory difficulties, mood swings or suicidal ideation Ophthalmic ROS: negative for - blurry vision, double vision, eye pain or loss of vision ENT ROS: negative for - epistaxis, nasal discharge, oral lesions, sore throat, tinnitus or vertigo Allergy and Immunology  ROS: negative for - hives or itchy/watery eyes Hematological and Lymphatic ROS: negative for - bleeding problems, bruising or swollen lymph nodes Endocrine ROS: negative for - galactorrhea, hair pattern changes, polydipsia/polyuria  or temperature intolerance Respiratory ROS: negative for - cough, hemoptysis, shortness of breath or wheezing Cardiovascular ROS: negative for - chest pain, dyspnea on exertion, edema or irregular heartbeat Gastrointestinal ROS: negative for - abdominal pain, diarrhea, hematemesis, nausea/vomiting or stool incontinence Genito-Urinary ROS: negative for - dysuria, hematuria, incontinence or urinary frequency/urgency Musculoskeletal ROS: negative for - joint swelling or muscular weakness Neurological ROS: as noted in HPI Dermatological ROS: negative for rash and skin lesion changes  Physical Examination: Blood pressure (!) 134/93, pulse 71, temperature 98.1 F (36.7 C), temperature source Oral, resp. rate 11, height 6' (1.829 m), weight 107 kg, SpO2 96 %.   HEENT-  Normocephalic, no lesions, without obvious abnormality.  Normal external eye and conjunctiva.  Normal TM's bilaterally.  Normal auditory canals and external ears. Normal external nose, mucus membranes and septum.  Normal pharynx. Cardiovascular- S1, S2 normal, pulses palpable throughout   Lungs- chest clear, no wheezing, rales, normal symmetric air entry Abdomen- soft, non-tender; bowel sounds normal; no masses,  no organomegaly Extremities- no edema Lymph-no adenopathy palpable Musculoskeletal-no joint tenderness, deformity or swelling Skin-warm and dry, no hyperpigmentation, vitiligo, or suspicious lesions  Neurological Exam   Mental Status: Alert, oriented, thought content appropriate.  Speech fluent without evidence of aphasia.  Able to follow 3 step commands without difficulty. Attention span and concentration seemed appropriate  Cranial Nerves: II: Discs flat bilaterally; Visual fields grossly normal, pupils equal, round, reactive to light and accommodation III,IV, VI: ptosis not present, extra-ocular motions intact bilaterally V,VII: smile symmetric, facial light touch sensation intact VIII: hearing normal  bilaterally IX,X: gag reflex present XI: bilateral shoulder shrug XII: midline tongue extension Motor: Right :  Upper extremity   5/5 Without pronator drift      Left: Upper extremity   5/5 without pronator drift Right:   Lower extremity   5/5                                          Left: Lower extremity   5/5 Tone and bulk:normal tone throughout; no atrophy noted Sensory: Pinprick and light touch decreased in the left hand Deep Tendon Reflexes: 2+ and symmetric throughout Plantars: Right: mute                              Left: mute Cerebellar: Finger-to-nose testing intact bilaterally. Heel to shin testing normal bilaterally Gait: not tested due to safety concerns  Data Reviewed  Laboratory Studies:  Basic Metabolic Panel: Recent Labs  Lab 02/19/19 1037  NA 137  K 4.0  CL 106  CO2 22  GLUCOSE 107*  BUN 18  CREATININE 1.18  CALCIUM 9.2    Liver Function Tests: Recent Labs  Lab 02/19/19 1037  AST 38  ALT 59*  ALKPHOS 141*  BILITOT 0.8  PROT 7.8  ALBUMIN 4.4   No results for input(s): LIPASE, AMYLASE in the last 168 hours. No results for input(s): AMMONIA in the last 168 hours.  CBC: Recent Labs  Lab 02/19/19 1037  WBC 11.8*  NEUTROABS 6.8  HGB 16.3  HCT 48.4  MCV 88.8  PLT 188    Cardiac Enzymes: No results  for input(s): CKTOTAL, CKMB, CKMBINDEX, TROPONINI in the last 168 hours.  BNP: Invalid input(s): POCBNP  CBG: Recent Labs  Lab 02/19/19 1027  GLUCAP 137*    Microbiology: No results found for this or any previous visit.  Coagulation Studies: Recent Labs    02/19/19 1037  LABPROT 12.0  INR 0.9    Urinalysis: No results for input(s): COLORURINE, LABSPEC, PHURINE, GLUCOSEU, HGBUR, BILIRUBINUR, KETONESUR, PROTEINUR, UROBILINOGEN, NITRITE, LEUKOCYTESUR in the last 168 hours.  Invalid input(s): APPERANCEUR  Lipid Panel:    Component Value Date/Time   CHOL 336 (H) 12/27/2018 1510   TRIG 349 (H) 12/27/2018 1510   HDL 40  12/27/2018 1510   CHOLHDL 8.4 (H) 12/27/2018 1510   LDLCALC 226 (H) 12/27/2018 1510    HgbA1C:  Lab Results  Component Value Date   HGBA1C 5.9 (H) 12/27/2018    Urine Drug Screen:  No results found for: LABOPIA, COCAINSCRNUR, LABBENZ, AMPHETMU, THCU, LABBARB  Alcohol Level: No results for input(s): ETH in the last 168 hours.  Other results: EKG: normal EKG, normal sinus rhythm, unchanged from previous tracings.  Imaging: Ct Head Code Stroke Wo Contrast`  Result Date: 02/19/2019 CLINICAL DATA:  Code stroke. Left arm numbness beginning 30 minutes ago. EXAM: CT HEAD WITHOUT CONTRAST TECHNIQUE: Contiguous axial images were obtained from the base of the skull through the vertex without intravenous contrast. COMPARISON:  None. FINDINGS: Brain: There is no evidence of acute infarct, intracranial hemorrhage, mass, midline shift, or extra-axial fluid collection. The ventricles and sulci are normal. A well-defined subcentimeter hypodensity laterally in the left cerebellar hemisphere is most compatible with a chronic infarct. Vascular: Calcified atherosclerosis at the skull base, advanced for age. No hyperdense vessel. Skull: No fracture or suspicious osseous lesion. Sinuses/Orbits: Mild right posterior ethmoid air cell mucosal thickening. Clear mastoid air cells. Unremarkable orbits. Other: None. ASPECTS [facility] Stroke Program Early CT Score) - Ganglionic level infarction (caudate, lentiform nuclei, internal capsule, insula, M1-M3 cortex): 7 - Supraganglionic infarction (M4-M6 cortex): 3 Total score (0-10 with 10 being normal): 10 IMPRESSION: 1. No evidence of acute intracranial abnormality. 2. ASPECTS is 10. 3. Small chronic cerebellar infarct. These results were called by telephone at the time of interpretation on 02/19/2019 at 10:47 am to Dr. Lenise Arena , who verbally acknowledged these results. Electronically Signed   By: Logan Bores M.D.   On: 02/19/2019 10:48    Assessment: 48 y.o. male  with past medical history of coronary artery disease, hypertension, migraine headaches and GERD presenting to the ED today with chief complaints of left hand numbness and tingling sensation since 9:30 am.  Concerns for ischemic event.  CT head reviewed and shows no acute intracranial abnormality.  Patient reports that he was not on any anticoagulation or antiplatelet prior to this event.  Further stroke work-up recommended  Stroke Risk Factors - family history and hypertension  Plan: 1. HgbA1c, fasting lipid panel 2. MRI, MRA  of the brain without contrast 3. PT consult, OT consult, Speech consult 4. Echocardiogram 5. Carotid dopplers 6. Prophylactic therapy-Antiplatelet med: Aspirin - dose 81 mg/day 7. NPO until RN stroke swallow screen 8. Telemetry monitoring 9. Frequent neuro checks   This patient was staffed with Dr. Irish Elders, Alease Frame who personally evaluated patient, reviewed documentation and agreed with assessment and plan of care as above.  Rufina Falco, DNP, FNP-BC Board certified Nurse Practitioner Neurology Department  02/19/2019, 11:23 AM

## 2019-02-19 NOTE — Progress Notes (Signed)
CODE STROKE- PHARMACY COMMUNICATION   Time CODE STROKE called/page received:1032  Time response to CODE STROKE was made (in person or via phone): 1100  Time Stroke Kit retrieved from [facility] (only if needed):N/A  Name of Provider/Nurse contacted:  Past Medical History:  Diagnosis Date  . Allergy   . GERD (gastroesophageal reflux disease)   . Headache    migraines   Prior to Admission medications   Medication Sig Start Date End Date Taking? Authorizing Provider  buPROPion (WELLBUTRIN SR) 150 MG 12 hr tablet Take 1 tablet (150 mg total) by mouth 2 (two) times daily. 01/31/19 05/01/19  Trinna Post, PA-C  ibuprofen (ADVIL,MOTRIN) 200 MG tablet Take 400 mg by mouth every 6 (six) hours as needed (For headache or body aches.).    [provider]  meloxicam (MOBIC) 7.5 MG tablet Take 1 tablet (7.5 mg total) by mouth daily. 01/30/19   Trinna Post, PA-C  omeprazole (PRILOSEC) 40 MG capsule Take 1 capsule (40 mg total) by mouth daily. 12/27/18 03/27/19  Trinna Post, PA-C  rosuvastatin (CRESTOR) 10 MG tablet Take 1 tablet (10 mg total) by mouth daily. 01/23/19   Trinna Post, PA-C    Charlett Nose ,PharmD Clinical Pharmacist  02/19/2019  11:16 AM

## 2019-02-19 NOTE — ED Triage Notes (Addendum)
Patient complaining of sudden onset of left arm numbness approx 30 min. Ago.  Patient states he has had similar episode in the past 2-3 mos. That resolved "on it's own".  Denies any other symptoms.  Alert and oriented X 4.  Grip strength equal, no weakness noted.  EKG done.  Denies cardiac hx. Reviewed patients symptoms with Dr.Williams and Code Stroke was initiated.

## 2019-02-19 NOTE — Code Documentation (Signed)
Pt arrives via POV with complaints of left sided numbness that began at 0930 while at work, pt states hx of similar symptoms with symptoms resolving on their own, code stroke activated upon arrival to ED, after CT pt taken to room 19, initial NIHSS of 1 with left sided numbness, pt states symptoms improving. No tPA due to improving symptoms, Dr. Irish Elders at bedside, report off to [facility]

## 2019-02-19 NOTE — Progress Notes (Signed)
OT Cancellation Note  Patient Details Name: Todd Massey MRN: [id number] DOB: 1970-12-04   Cancelled Treatment:    Reason Eval/Treat Not Completed: OT screened, no needs identified, will sign off. Order received, chart reviewed. Pt back to baseline functional independence. No impairments or functional deficits noted with brief screen. No skilled OT needs identified. Will sign off. Please re-consult if additional needs arise.   Jeni Salles, MPH, MS, OTR/L ascom [phone] 02/19/19, 3:52 PM

## 2019-02-19 NOTE — ED Provider Notes (Signed)
[facility] Emergency Department Provider Note       Time seen: ----------------------------------------- 10:41 AM on 02/19/2019 -----------------------------------------   I have reviewed the triage vital signs and the nursing notes.  HISTORY   Chief Complaint Numbness    HPI Alfonsa Vaile is a 48 y.o. male with a history of allergies, GERD, migraine headache who presents to the ED for sudden onset left arm numbness approximately 30 minutes ago.  Patient states he had a similar episode in the past 2 to 3 months ago which resolved on its own.  He denies any other symptoms, has normal strength without any weakness noted.  Past Medical History:  Diagnosis Date  . Allergy   . GERD (gastroesophageal reflux disease)   . Headache    migraines    There are no active problems to display for this patient.   Past Surgical History:  Procedure Laterality Date  . APPENDECTOMY    . EUS N/A 03/31/2016   Procedure: ESOPHAGEAL ENDOSCOPIC ULTRASOUND (EUS) RADIAL;  Surgeon: Arta Silence, MD;  Location: [facility];  Service: Endoscopy;  Laterality: N/A;    Allergies Penicillins  Social History Social History   Tobacco Use  . Smoking status: Current Every Day Smoker    Packs/day: 0.50    Years: 30.00    Pack years: 15.00    Types: Cigarettes  . Smokeless tobacco: Never Used  Substance Use Topics  . Alcohol use: No  . Drug use: No   Review of Systems Constitutional: Negative for fever. Cardiovascular: Negative for chest pain. Respiratory: Negative for shortness of breath. Gastrointestinal: Negative for abdominal pain, vomiting and diarrhea. Musculoskeletal: Negative for back pain. Skin: Negative for rash. Neurological: Negative for headaches, positive for left arm numbness  All systems negative/normal/unremarkable except as stated in the HPI  ____________________________________________   PHYSICAL EXAM:  VITAL SIGNS: ED Triage Vitals  Enc  Vitals Group     BP 02/19/19 1020 119/78     Pulse Rate 02/19/19 1020 72     Resp 02/19/19 1020 16     Temp 02/19/19 1020 98.1 F (36.7 C)     Temp Source 02/19/19 1020 Oral     SpO2 02/19/19 1020 97 %     Weight 02/19/19 1023 236 lb (107 kg)     Height 02/19/19 1023 6' (1.829 m)     Head Circumference --      Peak Flow --      Pain Score 02/19/19 1022 0     Pain Loc --      Pain Edu? --      Excl. in [place]? --    Constitutional: Alert and oriented. Well appearing and in no distress. Eyes: Conjunctivae are normal. Normal extraocular movements. ENT      Head: Normocephalic and atraumatic.      Nose: No congestion/rhinnorhea.      Mouth/Throat: Mucous membranes are moist.      Neck: No stridor. Cardiovascular: Normal rate, regular rhythm. No murmurs, rubs, or gallops. Respiratory: Normal respiratory effort without tachypnea nor retractions. Breath sounds are clear and equal bilaterally. No wheezes/rales/rhonchi. Gastrointestinal: Soft and nontender. Normal bowel sounds Musculoskeletal: Nontender with normal range of motion in extremities. No lower extremity tenderness nor edema. Neurologic:  Normal speech and language. No gross focal neurologic deficits are appreciated.  Skin:  Skin is warm, dry and intact. No rash noted. Psychiatric: Mood and affect are normal. Speech and behavior are normal.  ____________________________________________  EKG: Interpreted by me.  Sinus  rhythm the rate of 72 bpm, normal PR interval, normal QRS, normal QT  ____________________________________________  ED COURSE:  As part of my medical decision making, I reviewed the following data within the [facility] History obtained from family if available, nursing notes, old chart and ekg, as well as notes from prior ED visits. Patient presented for acute left arm numbness, we will assess with labs and imaging as indicated at this time.    Procedures ____________________________________________   LABS (pertinent positives/negatives)  Labs Reviewed  GLUCOSE, CAPILLARY - Abnormal; Notable for the following components:      Result Value   Glucose-Capillary 137 (*)    All other components within normal limits  PROTIME-INR  APTT  CBC  DIFFERENTIAL  COMPREHENSIVE METABOLIC PANEL  SEDIMENTATION RATE  C-REACTIVE PROTEIN  CBG MONITORING, ED  I-STAT CREATININE, ED    RADIOLOGY  CT head IMPRESSION: 1. No evidence of acute intracranial abnormality. 2. ASPECTS is 10. 3. Small chronic cerebellar infarct.  These results were called by telephone at the time of interpretation on 02/19/2019 at 10:47 am to Dr. Lenise Arena , who verbally acknowledged these results.  ____________________________________________   DIFFERENTIAL DIAGNOSIS   Paresthesia, TIA, CVA, complicated migraine  FINAL ASSESSMENT AND PLAN  Left arm numbness   Plan: The patient had presented for left arm numbness. Patient's labs were unremarkable. Patient's imaging did not reveal any acute process, he has a small chronic cerebellar infarct.  He was placed in an adult aspirin and was seen by neurology while in the ER.  We have ordered an MRI and he will at least need observation.  At this point numbness has not completely resolved.   Laurence Aly, MD    Note: This note was generated in part or whole with voice recognition software. Voice recognition is usually quite accurate but there are transcription errors that can and very often do occur. I apologize for any typographical errors that were not detected and corrected.     Earleen Newport, MD 02/19/19 1055

## 2019-02-19 NOTE — ED Notes (Signed)
Report to Melissa, RN

## 2019-02-19 NOTE — ED Notes (Signed)
ED TO INPATIENT HANDOFF REPORT  ED Nurse Name and Phone #: ally [phone]  S Name/Age/Gender Todd Massey 48 y.o. male Room/Bed: ED19A/ED19A  Code Status   Code Status: Not on file  Home/SNF/Other Home Patient oriented to: self, place, time and situation Is this baseline? Yes   Triage Complete: Triage complete  Chief Complaint left arm tingling  Triage Note Patient complaining of sudden onset of left arm numbness approx 30 min. Ago.  Patient states he has had similar episode in the past 2-3 mos. That resolved "on it's own".  Denies any other symptoms.  Alert and oriented X 4.  Grip strength equal, no weakness noted.  EKG done.  Denies cardiac hx.   Allergies Allergies  Allergen Reactions  . Penicillins Rash and Other (See Comments)    Has patient had a PCN reaction causing immediate rash, facial/tongue/throat swelling, SOB or lightheadedness with hypotension: yes Has patient had a PCN reaction causing severe rash involving mucus membranes or skin necrosis: no Has patient had a PCN reaction that required hospitalization no Has patient had a PCN reaction occurring within the last 10 years: no If all of the above answers are "NO", then may proceed with Cephalosporin use.     Level of Care/Admitting Diagnosis ED Disposition    ED Disposition Condition [facility] Area: [place] [[id number]]  Level of Care: Med-Surg [16]  Diagnosis: Left arm numbness [629476]  Admitting Physician: Hyman Bible DODD [[id number]]  Attending Physician: Hyman Bible DODD [[id number]]  PT Class (Do Not Modify): Observation [104]  PT Acc Code (Do Not Modify): Observation [10022]       B Medical/Surgery History Past Medical History:  Diagnosis Date  . Allergy   . GERD (gastroesophageal reflux disease)   . Headache    migraines   Past Surgical History:  Procedure Laterality Date  . APPENDECTOMY    . EUS N/A 03/31/2016   Procedure: ESOPHAGEAL ENDOSCOPIC ULTRASOUND  (EUS) RADIAL;  Surgeon: Arta Silence, MD;  Location: [facility];  Service: Endoscopy;  Laterality: N/A;     A IV Location/Drains/Wounds Patient Lines/Drains/Airways Status   Active Line/Drains/Airways    Name:   Placement date:   Placement time:   Site:   Days:   Peripheral IV 02/19/19 Right Forearm   02/19/19    1044    Forearm   less than 1          Intake/Output Last 24 hours No intake or output data in the 24 hours ending 02/19/19 1155  Labs/Imaging Results for orders placed or performed during the hospital encounter of 02/19/19 (from the past 48 hour(s))  Glucose, capillary     Status: Abnormal   Collection Time: 02/19/19 10:27 AM  Result Value Ref Range   Glucose-Capillary 137 (H) 70 - 99 mg/dL  Protime-INR     Status: None   Collection Time: 02/19/19 10:37 AM  Result Value Ref Range   Prothrombin Time 12.0 11.4 - 15.2 seconds   INR 0.9 0.8 - 1.2    Comment: (NOTE) INR goal varies based on device and disease states. Performed at [facility], [address] [place], [place] [address]   APTT     Status: None   Collection Time: 02/19/19 10:37 AM  Result Value Ref Range   aPTT 26 24 - 36 seconds    Comment: Performed at [facility], [address] [place], [place] [address]  CBC     Status: Abnormal  Collection Time: 02/19/19 10:37 AM  Result Value Ref Range   WBC 11.8 (H) 4.0 - 10.5 K/uL   RBC 5.45 4.22 - 5.81 MIL/uL   Hemoglobin 16.3 13.0 - 17.0 g/dL   HCT 48.4 39.0 - 52.0 %   MCV 88.8 80.0 - 100.0 fL   MCH 29.9 26.0 - 34.0 pg   MCHC 33.7 30.0 - 36.0 g/dL   RDW 13.5 11.5 - 15.5 %   Platelets 188 150 - 400 K/uL   nRBC 0.0 0.0 - 0.2 %    Comment: Performed at [facility], [address] [place], [place] [address]  Differential     Status: None   Collection Time: 02/19/19 10:37 AM  Result Value Ref Range   Neutrophils Relative % 58 %   Neutro Abs 6.8 1.7 - 7.7 K/uL   Lymphocytes Relative 32 %   Lymphs Abs 3.8 0.7 -  4.0 K/uL   Monocytes Relative 6 %   Monocytes Absolute 0.7 0.1 - 1.0 K/uL   Eosinophils Relative 3 %   Eosinophils Absolute 0.3 0.0 - 0.5 K/uL   Basophils Relative 1 %   Basophils Absolute 0.1 0.0 - 0.1 K/uL   Immature Granulocytes 0 %   Abs Immature Granulocytes 0.03 0.00 - 0.07 K/uL    Comment: Performed at [facility], [address] [place], [place] [address]  Comprehensive metabolic panel     Status: Abnormal   Collection Time: 02/19/19 10:37 AM  Result Value Ref Range   Sodium 137 135 - 145 mmol/L   Potassium 4.0 3.5 - 5.1 mmol/L   Chloride 106 98 - 111 mmol/L   CO2 22 22 - 32 mmol/L   Glucose, Bld 107 (H) 70 - 99 mg/dL   BUN 18 6 - 20 mg/dL   Creatinine, Ser 1.18 0.61 - 1.24 mg/dL   Calcium 9.2 8.9 - 10.3 mg/dL   Total Protein 7.8 6.5 - 8.1 g/dL   Albumin 4.4 3.5 - 5.0 g/dL   AST 38 15 - 41 U/L   ALT 59 (H) 0 - 44 U/L   Alkaline Phosphatase 141 (H) 38 - 126 U/L   Total Bilirubin 0.8 0.3 - 1.2 mg/dL   GFR calc non Af Amer >60 >60 mL/min   GFR calc Af Amer >60 >60 mL/min   Anion gap 9 5 - 15    Comment: Performed at [facility], [address] [place], [place] [address]  Sedimentation rate     Status: None   Collection Time: 02/19/19 10:49 AM  Result Value Ref Range   Sed Rate 3 0 - 15 mm/hr    Comment: Performed at [facility], [address] [place],  [address]   Ct Head Code Stroke Wo Contrast`  Result Date: 02/19/2019 CLINICAL DATA:  Code stroke. Left arm numbness beginning 30 minutes ago. EXAM: CT HEAD WITHOUT CONTRAST TECHNIQUE: Contiguous axial images were obtained from the base of the skull through the vertex without intravenous contrast. COMPARISON:  None. FINDINGS: Brain: There is no evidence of acute infarct, intracranial hemorrhage, mass, midline shift, or extra-axial fluid collection. The ventricles and sulci are normal. A well-defined subcentimeter hypodensity laterally in the left cerebellar hemisphere is most  compatible with a chronic infarct. Vascular: Calcified atherosclerosis at the skull base, advanced for age. No hyperdense vessel. Skull: No fracture or suspicious osseous lesion. Sinuses/Orbits: Mild right posterior ethmoid air cell mucosal thickening. Clear mastoid air cells. Unremarkable orbits. Other: None. ASPECTS [facility] Stroke Program  Early CT Score) - Ganglionic level infarction (caudate, lentiform nuclei, internal capsule, insula, M1-M3 cortex): 7 - Supraganglionic infarction (M4-M6 cortex): 3 Total score (0-10 with 10 being normal): 10 IMPRESSION: 1. No evidence of acute intracranial abnormality. 2. ASPECTS is 10. 3. Small chronic cerebellar infarct. These results were called by telephone at the time of interpretation on 02/19/2019 at 10:47 am to Dr. Lenise Arena , who verbally acknowledged these results. Electronically Signed   By: Logan Bores M.D.   On: 02/19/2019 10:48    Pending Labs Unresulted Labs (From admission, onward)    Start     Ordered   02/19/19 1049  C-reactive protein  Once,   STAT     02/19/19 1048   Signed and Held  Hemoglobin A1c  Tomorrow morning,   R     Signed and Held   Signed and Held  Lipid panel  Tomorrow morning,   R    Comments:  Fasting    Signed and Held          Vitals/Pain Today's Vitals   02/19/19 1020 02/19/19 1022 02/19/19 1023 02/19/19 1100  BP: 119/78   (!) 134/93  Pulse: 72   71  Resp: 16   11  Temp: 98.1 F (36.7 C)     TempSrc: Oral     SpO2: 97%   96%  Weight:   107 kg   Height:   6' (1.829 m)   PainSc:  0-No pain      Isolation Precautions No active isolations  Medications Medications  aspirin chewable tablet 324 mg (324 mg Oral Given 02/19/19 1055)    Mobility walks     Focused Assessments Neuro Assessment Handoff:  Swallow screen pass? Yes    NIH Stroke Scale ( + Modified Stroke Scale Criteria)  Interval: Initial Level of Consciousness (1a.)   : Alert, keenly responsive LOC Questions (1b. )   +: Answers  both questions correctly LOC Commands (1c. )   + : Performs both tasks correctly Best Gaze (2. )  +: Normal Visual (3. )  +: No visual loss Facial Palsy (4. )    : Normal symmetrical movements Motor Arm, Left (5a. )   +: No drift Motor Arm, Right (5b. )   +: No drift Motor Leg, Left (6a. )   +: No drift Motor Leg, Right (6b. )   +: No drift Limb Ataxia (7. ): Absent Sensory (8. )   +: Mild-to-moderate sensory loss, patient feels pinprick is less sharp or is dull on the affected side, or there is a loss of superficial pain with pinprick, but patient is aware of being touched(in arms) Best Language (9. )   +: No aphasia Dysarthria (10. ): Normal Extinction/Inattention (11.)   +: No Abnormality Modified SS Total  +: 1 Complete NIHSS TOTAL: 1 Last date known well: 02/19/19 Last time known well: 0945 Neuro Assessment: Within Defined Limits Neuro Checks:   Initial (02/19/19 1043)  Last Documented NIHSS Modified Score: 1 (02/19/19 1043) Has TPA been given? No If patient is a Neuro Trauma and patient is going to OR before floor call report to [place] nurse: [phone] or [phone]     R Recommendations: See Admitting Provider Note  Report given to:   Additional Notes:

## 2019-02-19 NOTE — Progress Notes (Signed)
OT Cancellation Note  Patient Details Name: Todd Massey MRN: [id number] DOB: 08/04/1971   Cancelled Treatment:    Reason Eval/Treat Not Completed: Patient at procedure or test/ unavailable. Order received, chart reviewed. Pt out of room for testing. Will re-attempt OT evaluation at later date/time as pt is available and medically appropriate.  Jeni Salles, MPH, MS, OTR/L ascom [phone] 02/19/19, 1:25 PM

## 2019-02-19 NOTE — ED Notes (Signed)
ED Provider at bedside. 

## 2019-02-20 LAB — HEPATITIS PANEL, ACUTE
HCV Ab: <0.1 s/co ratio (ref 0.0–0.9)
Hep A IgM: NEGATIVE
Hep B C IgM: NEGATIVE
Hepatitis B Surface Ag: NEGATIVE

## 2019-02-20 LAB — ECHOCARDIOGRAM COMPLETE
Height: 72 in
Weight: 3776 oz

## 2019-02-20 LAB — HEMOGLOBIN A1C
Hgb A1c MFr Bld: 5.7 % — ABNORMAL HIGH (ref 4.8–5.6)
Mean Plasma Glucose: 116.89 mg/dL

## 2019-02-20 LAB — LIPID PANEL
CHOL/HDL RATIO: 5.7 ratio
Cholesterol: 194 mg/dL (ref 0–200)
HDL: 34 mg/dL — ABNORMAL LOW (ref >40)
LDL Cholesterol: 108 mg/dL — ABNORMAL HIGH (ref 0–99)
Triglycerides: 258 mg/dL — ABNORMAL HIGH (ref <150)
VLDL: 52 mg/dL — AB (ref 0–40)

## 2019-02-20 MED ORDER — ASPIRIN 81 MG PO CHEW: 81.0000 mg | CHEWABLE_TABLET | Freq: Every day | ORAL | 0 refills | Status: DC

## 2019-02-20 MED ORDER — MAGNESIUM 500 MG PO CAPS: 500.0000 mg | ORAL_CAPSULE | Freq: Every day | ORAL | 0 refills | Status: DC

## 2019-02-20 NOTE — Discharge Summary (Signed)
Todd Massey at [place] NAME: Todd Massey    MR#:  [id number]  DATE OF BIRTH:  1971-06-30  DATE OF ADMISSION:  02/19/2019 ADMITTING PHYSICIAN: Sela Hua, MD  DATE OF DISCHARGE: 02/20/2019  PRIMARY CARE PHYSICIAN: Trinna Post, PA-C    ADMISSION DIAGNOSIS:  Left arm numbness [R20.0]  DISCHARGE DIAGNOSIS:  Active Problems:   Left arm numbness   SECONDARY DIAGNOSIS:   Past Medical History:  Diagnosis Date  . Allergy   . GERD (gastroesophageal reflux disease)   . Headache    migraines    HOSPITAL COURSE:   48 year old male with history of migraine headaches and tobacco dependence who presented to the emergency room due to left sided weakness.  1.  Left-sided weakness due to complicated migraine: Patient underwent CVA work-up which essentially was unremarkable.  MRI is consistent with migraine pathology. Patient evaluated by neurology.  Recommendation for magnesium 500 mg daily and an aspirin due to some microvascular changes noted on MRI.  Patient will follow-up with outpatient neurology in 2 to 3 weeks.  2.Tobacco dependence: Patient is encouraged to quit smoking and willing to attempt to quit was assessed. Patient highly motivated.Counseling was provided for 4 minutes. His PCP has provided Wellbutrin to help with quitting smoking.  3. HLD: Continue statin  4. GERD: Continue Prilosec.    DISCHARGE CONDITIONS AND DIET:  Stable Regular diet  CONSULTS OBTAINED:  Treatment Team:  Catarina Hartshorn, MD  DRUG ALLERGIES:   Allergies  Allergen Reactions  . Penicillins Rash and Other (See Comments)    Has patient had a PCN reaction causing immediate rash, facial/tongue/throat swelling, SOB or lightheadedness with hypotension: yes Has patient had a PCN reaction causing severe rash involving mucus membranes or skin necrosis: no Has patient had a PCN reaction that required hospitalization no Has patient had a PCN reaction  occurring within the last 10 years: no If all of the above answers are "NO", then may proceed with Cephalosporin use.     DISCHARGE MEDICATIONS:   Allergies as of 02/20/2019      Reactions   Penicillins Rash, Other (See Comments)   Has patient had a PCN reaction causing immediate rash, facial/tongue/throat swelling, SOB or lightheadedness with hypotension: yes Has patient had a PCN reaction causing severe rash involving mucus membranes or skin necrosis: no Has patient had a PCN reaction that required hospitalization no Has patient had a PCN reaction occurring within the last 10 years: no If all of the above answers are "NO", then may proceed with Cephalosporin use.      Medication List    TAKE these medications   aspirin 81 MG chewable tablet Chew 1 tablet (81 mg total) by mouth daily. Start taking on:  February 21, 2019   ibuprofen 200 MG tablet Commonly known as:  ADVIL,MOTRIN Take 400 mg by mouth every 6 (six) hours as needed (For headache or body aches.).   Magnesium 500 MG Caps Take 1 capsule (500 mg total) by mouth daily.   omeprazole 40 MG capsule Commonly known as:  PRILOSEC Take 1 capsule (40 mg total) by mouth daily.   rosuvastatin 10 MG tablet Commonly known as:  Crestor Take 1 tablet (10 mg total) by mouth daily.         Today   CHIEF COMPLAINT:   Patient with improved symptoms.   VITAL SIGNS:  Blood pressure 118/88, pulse 63, temperature 97.6 F (36.4 C), temperature source Oral, resp. rate 18,  height 6' (1.829 m), weight 107 kg, SpO2 95 %.   REVIEW OF SYSTEMS:  Review of Systems  Constitutional: Negative.  Negative for chills, fever and malaise/fatigue.  HENT: Negative.  Negative for ear discharge, ear pain, hearing loss, nosebleeds and sore throat.   Eyes: Negative.  Negative for blurred vision and pain.  Respiratory: Negative.  Negative for cough, hemoptysis, shortness of breath and wheezing.   Cardiovascular: Negative.  Negative for chest  pain, palpitations and leg swelling.  Gastrointestinal: Negative.  Negative for abdominal pain, blood in stool, diarrhea, nausea and vomiting.  Genitourinary: Negative.  Negative for dysuria.  Musculoskeletal: Negative.  Negative for back pain.  Skin: Negative.   Neurological: Negative for dizziness, tremors, speech change, focal weakness, seizures and headaches.  Endo/Heme/Allergies: Negative.  Does not bruise/bleed easily.  Psychiatric/Behavioral: Negative.  Negative for depression, hallucinations and suicidal ideas.     PHYSICAL EXAMINATION:  GENERAL:  48 y.o.-year-old patient lying in the bed with no acute distress.  NECK:  Supple, no jugular venous distention. No thyroid enlargement, no tenderness.  LUNGS: Normal breath sounds bilaterally, no wheezing, rales,rhonchi  No use of accessory muscles of respiration.  CARDIOVASCULAR: S1, S2 normal. No murmurs, rubs, or gallops.  ABDOMEN: Soft, non-tender, non-distended. Bowel sounds present. No organomegaly or mass.  EXTREMITIES: No pedal edema, cyanosis, or clubbing.  PSYCHIATRIC: The patient is alert and oriented x 3.  SKIN: No obvious rash, lesion, or ulcer.   DATA REVIEW:   CBC Recent Labs  Lab 02/19/19 1037  WBC 11.8*  HGB 16.3  HCT 48.4  PLT 188    Chemistries  Recent Labs  Lab 02/19/19 1037  NA 137  K 4.0  CL 106  CO2 22  GLUCOSE 107*  BUN 18  CREATININE 1.18  CALCIUM 9.2  AST 38  ALT 59*  ALKPHOS 141*  BILITOT 0.8    Cardiac Enzymes No results for input(s): TROPONINI in the last 168 hours.  Microbiology Results  @MICRORSLT48 @  RADIOLOGY:  Mr Brain 29 Contrast  Result Date: 02/19/2019 CLINICAL DATA:  Focal neuro deficit, less than 6 hours. New onset of left arm numbness last couple of hours. Numbness has since resolved. Patient denies weakness. EXAM: MRI HEAD WITHOUT CONTRAST MRA HEAD WITHOUT CONTRAST TECHNIQUE: Multiplanar, multiecho pulse sequences of the brain and surrounding structures were  obtained without intravenous contrast. Angiographic images of the head were obtained using MRA technique without contrast. COMPARISON:  None. FINDINGS: MRI HEAD FINDINGS Brain: Scattered punctate subcortical T2 hyperintensities bilaterally are mildly advanced for age. No focal lesion is present otherwise. The ventricles are of normal size. No significant extraaxial fluid collection is present. The internal auditory canals are within normal limits. The brainstem and cerebellum are within normal limits. Vascular: Flow is present in the major intracranial arteries. Skull and upper cervical spine: The craniocervical junction is normal. Upper cervical spine is within normal limits. Marrow signal is unremarkable. Sinuses/Orbits: Mild mucosal thickening is present in the right maxillary sinus posterior right ethmoid air cells. There is mucosal thickening of the left maxillary sinus. The globes and orbits are within normal limits. MRA HEAD FINDINGS The internal carotid arteries are within normal limits from the high cervical segments through the ICA termini. The A1 M1 segments are normal. MCA bifurcations are intact bilaterally. ACA and MCA branch vessels are. The vertebral arteries are codominant. Left PICA origin is visualized and normal. The right AICA is dominant. Basilar artery is normal. Right posterior cerebral artery is of fetal type. A small  P1 segment is normal. PCA branch vessels are normal bilaterally. IMPRESSION: 1. Scattered punctate subcortical T2 hyperintensities bilaterally are mildly advanced for age. The finding is nonspecific but can be seen in the setting of chronic microvascular ischemia, a demyelinating process such as multiple sclerosis, vasculitis, complicated migraine headaches, or as the sequelae of a prior infectious or inflammatory process. 2. No acute intracranial abnormality. 3. Normal MRA circle of Willis. No significant proximal stenosis, aneurysm, or branch vessel occlusion. Electronically  Signed   By: San Morelle M.D.   On: 02/19/2019 14:32   US Carotid Bilateral (at [facility] Only)  Result Date: 02/19/2019 CLINICAL DATA:  Left arm numbness EXAM: BILATERAL CAROTID DUPLEX ULTRASOUND TECHNIQUE: Pearline Cables scale imaging, color Doppler and duplex ultrasound were performed of bilateral carotid and vertebral arteries in the neck. COMPARISON:  None FINDINGS: Criteria: Quantification of carotid stenosis is based on velocity parameters that correlate the residual internal carotid diameter with NASCET-based stenosis levels, using the diameter of the distal internal carotid lumen as the denominator for stenosis measurement. The following velocity measurements were obtained: RIGHT ICA: 69/32 cm/sec CCA: 86/76 cm/sec SYSTOLIC ICA/CCA RATIO:  1 ECA: 88 cm/sec LEFT ICA: 89/38 cm/sec CCA: 19/50 cm/sec SYSTOLIC ICA/CCA RATIO:  1.1 ECA: 96 cm/sec RIGHT CAROTID ARTERY: Preliminary grayscale images demonstrate no significant atherosclerotic plaque. Mild intimal thickening is noted. Minimal plaque is noted in the region of the proximal internal carotid artery. The waveforms, velocities and flow velocity ratios show no evidence of focal hemodynamically significant stenosis. RIGHT VERTEBRAL ARTERY:  Antegrade in nature. LEFT CAROTID ARTERY: Preliminary grayscale images demonstrate mild intimal thickening. No significant atherosclerotic plaque is noted. The waveforms, velocities and flow velocity ratios show no evidence of focal hemodynamically significant stenosis. LEFT VERTEBRAL ARTERY:  Antegrade in nature. IMPRESSION: Minimal atherosclerotic plaque in the right internal carotid artery. No evidence of focal hemodynamically significant stenosis bilaterally. Electronically Signed   By: Inez Catalina M.D.   On: 02/19/2019 14:25   Mr Jodene Nam Head/brain DT Cm  Result Date: 02/19/2019 CLINICAL DATA:  Focal neuro deficit, less than 6 hours. New onset of left arm numbness last couple of hours. Numbness has since  resolved. Patient denies weakness. EXAM: MRI HEAD WITHOUT CONTRAST MRA HEAD WITHOUT CONTRAST TECHNIQUE: Multiplanar, multiecho pulse sequences of the brain and surrounding structures were obtained without intravenous contrast. Angiographic images of the head were obtained using MRA technique without contrast. COMPARISON:  None. FINDINGS: MRI HEAD FINDINGS Brain: Scattered punctate subcortical T2 hyperintensities bilaterally are mildly advanced for age. No focal lesion is present otherwise. The ventricles are of normal size. No significant extraaxial fluid collection is present. The internal auditory canals are within normal limits. The brainstem and cerebellum are within normal limits. Vascular: Flow is present in the major intracranial arteries. Skull and upper cervical spine: The craniocervical junction is normal. Upper cervical spine is within normal limits. Marrow signal is unremarkable. Sinuses/Orbits: Mild mucosal thickening is present in the right maxillary sinus posterior right ethmoid air cells. There is mucosal thickening of the left maxillary sinus. The globes and orbits are within normal limits. MRA HEAD FINDINGS The internal carotid arteries are within normal limits from the high cervical segments through the ICA termini. The A1 M1 segments are normal. MCA bifurcations are intact bilaterally. ACA and MCA branch vessels are. The vertebral arteries are codominant. Left PICA origin is visualized and normal. The right AICA is dominant. Basilar artery is normal. Right posterior cerebral artery is of fetal type. A small P1 segment  is normal. PCA branch vessels are normal bilaterally. IMPRESSION: 1. Scattered punctate subcortical T2 hyperintensities bilaterally are mildly advanced for age. The finding is nonspecific but can be seen in the setting of chronic microvascular ischemia, a demyelinating process such as multiple sclerosis, vasculitis, complicated migraine headaches, or as the sequelae of a prior  infectious or inflammatory process. 2. No acute intracranial abnormality. 3. Normal MRA circle of Willis. No significant proximal stenosis, aneurysm, or branch vessel occlusion. Electronically Signed   By: San Morelle M.D.   On: 02/19/2019 14:32   Ct Head Code Stroke Wo Contrast`  Result Date: 02/19/2019 CLINICAL DATA:  Code stroke. Left arm numbness beginning 30 minutes ago. EXAM: CT HEAD WITHOUT CONTRAST TECHNIQUE: Contiguous axial images were obtained from the base of the skull through the vertex without intravenous contrast. COMPARISON:  None. FINDINGS: Brain: There is no evidence of acute infarct, intracranial hemorrhage, mass, midline shift, or extra-axial fluid collection. The ventricles and sulci are normal. A well-defined subcentimeter hypodensity laterally in the left cerebellar hemisphere is most compatible with a chronic infarct. Vascular: Calcified atherosclerosis at the skull base, advanced for age. No hyperdense vessel. Skull: No fracture or suspicious osseous lesion. Sinuses/Orbits: Mild right posterior ethmoid air cell mucosal thickening. Clear mastoid air cells. Unremarkable orbits. Other: None. ASPECTS [facility] Stroke Program Early CT Score) - Ganglionic level infarction (caudate, lentiform nuclei, internal capsule, insula, M1-M3 cortex): 7 - Supraganglionic infarction (M4-M6 cortex): 3 Total score (0-10 with 10 being normal): 10 IMPRESSION: 1. No evidence of acute intracranial abnormality. 2. ASPECTS is 10. 3. Small chronic cerebellar infarct. These results were called by telephone at the time of interpretation on 02/19/2019 at 10:47 am to Dr. Lenise Arena , who verbally acknowledged these results. Electronically Signed   By: Logan Bores M.D.   On: 02/19/2019 10:48   US Abdomen Limited Ruq  Result Date: 02/19/2019 CLINICAL DATA:  Elevated LFTs EXAM: ULTRASOUND ABDOMEN LIMITED RIGHT UPPER QUADRANT COMPARISON:  04/29/2016 FINDINGS: Gallbladder: Poor visualization due to  difficulty penetrating the liver. No definitive cholelithiasis is seen. Common bile duct: Diameter: 7.3 mm. Liver: Coarsened increased echogenicity consistent with fatty infiltration. This is similar to that seen on prior MRI examination. Portal vein is patent on color Doppler imaging with normal direction of blood flow towards the liver. IMPRESSION: Fatty liver. No other focal abnormality is noted. Electronically Signed   By: Inez Catalina M.D.   On: 02/19/2019 14:23      Allergies as of 02/20/2019      Reactions   Penicillins Rash, Other (See Comments)   Has patient had a PCN reaction causing immediate rash, facial/tongue/throat swelling, SOB or lightheadedness with hypotension: yes Has patient had a PCN reaction causing severe rash involving mucus membranes or skin necrosis: no Has patient had a PCN reaction that required hospitalization no Has patient had a PCN reaction occurring within the last 10 years: no If all of the above answers are "NO", then may proceed with Cephalosporin use.      Medication List    TAKE these medications   aspirin 81 MG chewable tablet Chew 1 tablet (81 mg total) by mouth daily. Start taking on:  February 21, 2019   ibuprofen 200 MG tablet Commonly known as:  ADVIL,MOTRIN Take 400 mg by mouth every 6 (six) hours as needed (For headache or body aches.).   Magnesium 500 MG Caps Take 1 capsule (500 mg total) by mouth daily.   omeprazole 40 MG capsule Commonly known as:  PRILOSEC  Take 1 capsule (40 mg total) by mouth daily.   rosuvastatin 10 MG tablet Commonly known as:  Crestor Take 1 tablet (10 mg total) by mouth daily.          Management plans discussed with the patient and he is in agreement. Stable for discharge home  Patient should follow up with pcp  CODE STATUS:     Code Status Orders  (From admission, onward)         Start     Ordered   02/19/19 1231  Full code  Continuous     02/19/19 1230        Code Status History     This patient has a current code status but no historical code status.      TOTAL TIME TAKING CARE OF THIS PATIENT: 38 minutes.    Note: This dictation was prepared with Dragon dictation along with smaller phrase technology. Any transcriptional errors that result from this process are unintentional.  Bettey Costa M.D on 02/20/2019 at 10:18 AM  Between 7am to 6pm - Pager - [phone] After 6pm go to [email or web address] - password [email or web address] [facility]  Office  [phone]  CC: Primary care physician; Trinna Post, PA-C

## 2019-02-20 NOTE — Progress Notes (Addendum)
Subjective: No new stroke or strokelike symptoms reported.  Patient reports mild discomfort in the left temporal area however this is improved since yesterday.  Left hand numbness tingling sensation now resolved.  Objective: Current vital signs: BP 118/88 (BP Location: Left Arm)   Pulse 63   Temp 97.6 F (36.4 C) (Oral)   Resp 18   Ht 6' (1.829 m)   Wt 107 kg   SpO2 95%   BMI 32.01 kg/m  Vital signs in last 24 hours: Temp:  [97.6 F (36.4 C)-98.8 F (37.1 C)] 97.6 F (36.4 C) (03/31 0812) Pulse Rate:  [61-72] 63 (03/31 0812) Resp:  [11-18] 18 (03/31 0812) BP: (101-134)/(65-93) 118/88 (03/31 0812) SpO2:  [95 %-100 %] 95 % (03/31 0812) Weight:  [431 kg] 107 kg (03/30 1232)  Intake/Output from previous day: 03/30 0701 - 03/31 0700 In: 240 [P.O.:240] Out: -  Intake/Output this shift: No intake/output data recorded. Nutritional status:  Diet Order            Diet Heart Room service appropriate? Yes; Fluid consistency: Thin  Diet effective now             Neurological Exam  Mental Status: Alert, oriented, thought content appropriate. Speech fluent without evidence of aphasia. Able to follow 3 step commands without difficulty. Attention span and concentration seemed appropriate  Cranial Nerves: II: Discs flat bilaterally; Visual fields grossly normal, pupils equal, round, reactive to light and accommodation III,IV, VI: ptosis not present, extra-ocular motions intact bilaterally V,VII: smile symmetric, facial light touch sensationintact VIII: hearing normal bilaterally IX,X: gag reflex present XI: bilateral shoulder shrug XII: midline tongue extension Motor: Right :Upper extremity 5/5Without pronator driftLeft: Upper extremity 5/5 without pronator drift Right:Lower extremity 5/5Left: Lower extremity 5/5 Tone and bulk:normal tone throughout; no atrophy noted Sensory: Pinprick and light touchintact  bilaterally Deep Tendon Reflexes: 2+ and symmetric throughout Plantars: Right:muteLeft: mute Cerebellar: Finger-to-nosetesting intact bilaterally.Heel to shin testing normal bilaterally Gait: not tested due to safety concerns  Data Reviewed   Lab Results: Basic Metabolic Panel: Recent Labs  Lab 02/19/19 1037  NA 137  K 4.0  CL 106  CO2 22  GLUCOSE 107*  BUN 18  CREATININE 1.18  CALCIUM 9.2    Liver Function Tests: Recent Labs  Lab 02/19/19 1037  AST 38  ALT 59*  ALKPHOS 141*  BILITOT 0.8  PROT 7.8  ALBUMIN 4.4   No results for input(s): LIPASE, AMYLASE in the last 168 hours. No results for input(s): AMMONIA in the last 168 hours.  CBC: Recent Labs  Lab 02/19/19 1037  WBC 11.8*  NEUTROABS 6.8  HGB 16.3  HCT 48.4  MCV 88.8  PLT 188    Cardiac Enzymes: No results for input(s): CKTOTAL, CKMB, CKMBINDEX, TROPONINI in the last 168 hours.  Lipid Panel: Recent Labs  Lab 02/20/19 0354  CHOL 194  TRIG 258*  HDL 34*  CHOLHDL 5.7  VLDL 52*  LDLCALC 108*    CBG: Recent Labs  Lab 02/19/19 1027  GLUCAP 137*    Microbiology: No results found for this or any previous visit.  Coagulation Studies: Recent Labs    02/19/19 1037  LABPROT 12.0  INR 0.9    Imaging: Mr Brain Wo Contrast  Result Date: 02/19/2019 CLINICAL DATA:  Focal neuro deficit, less than 6 hours. New onset of left arm numbness last couple of hours. Numbness has since resolved. Patient denies weakness. EXAM: MRI HEAD WITHOUT CONTRAST MRA HEAD WITHOUT CONTRAST TECHNIQUE: Multiplanar, multiecho pulse sequences  of the brain and surrounding structures were obtained without intravenous contrast. Angiographic images of the head were obtained using MRA technique without contrast. COMPARISON:  None. FINDINGS: MRI HEAD FINDINGS Brain: Scattered punctate subcortical T2 hyperintensities bilaterally are mildly advanced for age. No focal lesion is present  otherwise. The ventricles are of normal size. No significant extraaxial fluid collection is present. The internal auditory canals are within normal limits. The brainstem and cerebellum are within normal limits. Vascular: Flow is present in the major intracranial arteries. Skull and upper cervical spine: The craniocervical junction is normal. Upper cervical spine is within normal limits. Marrow signal is unremarkable. Sinuses/Orbits: Mild mucosal thickening is present in the right maxillary sinus posterior right ethmoid air cells. There is mucosal thickening of the left maxillary sinus. The globes and orbits are within normal limits. MRA HEAD FINDINGS The internal carotid arteries are within normal limits from the high cervical segments through the ICA termini. The A1 M1 segments are normal. MCA bifurcations are intact bilaterally. ACA and MCA branch vessels are. The vertebral arteries are codominant. Left PICA origin is visualized and normal. The right AICA is dominant. Basilar artery is normal. Right posterior cerebral artery is of fetal type. A small P1 segment is normal. PCA branch vessels are normal bilaterally. IMPRESSION: 1. Scattered punctate subcortical T2 hyperintensities bilaterally are mildly advanced for age. The finding is nonspecific but can be seen in the setting of chronic microvascular ischemia, a demyelinating process such as multiple sclerosis, vasculitis, complicated migraine headaches, or as the sequelae of a prior infectious or inflammatory process. 2. No acute intracranial abnormality. 3. Normal MRA circle of Willis. No significant proximal stenosis, aneurysm, or branch vessel occlusion. Electronically Signed   By: San Morelle M.D.   On: 02/19/2019 14:32   US Carotid Bilateral (at [facility] Only)  Result Date: 02/19/2019 CLINICAL DATA:  Left arm numbness EXAM: BILATERAL CAROTID DUPLEX ULTRASOUND TECHNIQUE: Pearline Cables scale imaging, color Doppler and duplex ultrasound were performed of  bilateral carotid and vertebral arteries in the neck. COMPARISON:  None FINDINGS: Criteria: Quantification of carotid stenosis is based on velocity parameters that correlate the residual internal carotid diameter with NASCET-based stenosis levels, using the diameter of the distal internal carotid lumen as the denominator for stenosis measurement. The following velocity measurements were obtained: RIGHT ICA: 69/32 cm/sec CCA: 09/81 cm/sec SYSTOLIC ICA/CCA RATIO:  1 ECA: 88 cm/sec LEFT ICA: 89/38 cm/sec CCA: 19/14 cm/sec SYSTOLIC ICA/CCA RATIO:  1.1 ECA: 96 cm/sec RIGHT CAROTID ARTERY: Preliminary grayscale images demonstrate no significant atherosclerotic plaque. Mild intimal thickening is noted. Minimal plaque is noted in the region of the proximal internal carotid artery. The waveforms, velocities and flow velocity ratios show no evidence of focal hemodynamically significant stenosis. RIGHT VERTEBRAL ARTERY:  Antegrade in nature. LEFT CAROTID ARTERY: Preliminary grayscale images demonstrate mild intimal thickening. No significant atherosclerotic plaque is noted. The waveforms, velocities and flow velocity ratios show no evidence of focal hemodynamically significant stenosis. LEFT VERTEBRAL ARTERY:  Antegrade in nature. IMPRESSION: Minimal atherosclerotic plaque in the right internal carotid artery. No evidence of focal hemodynamically significant stenosis bilaterally. Electronically Signed   By: Inez Catalina M.D.   On: 02/19/2019 14:25   Mr Jodene Nam Head/brain NW Cm  Result Date: 02/19/2019 CLINICAL DATA:  Focal neuro deficit, less than 6 hours. New onset of left arm numbness last couple of hours. Numbness has since resolved. Patient denies weakness. EXAM: MRI HEAD WITHOUT CONTRAST MRA HEAD WITHOUT CONTRAST TECHNIQUE: Multiplanar, multiecho pulse sequences of the  brain and surrounding structures were obtained without intravenous contrast. Angiographic images of the head were obtained using MRA technique without  contrast. COMPARISON:  None. FINDINGS: MRI HEAD FINDINGS Brain: Scattered punctate subcortical T2 hyperintensities bilaterally are mildly advanced for age. No focal lesion is present otherwise. The ventricles are of normal size. No significant extraaxial fluid collection is present. The internal auditory canals are within normal limits. The brainstem and cerebellum are within normal limits. Vascular: Flow is present in the major intracranial arteries. Skull and upper cervical spine: The craniocervical junction is normal. Upper cervical spine is within normal limits. Marrow signal is unremarkable. Sinuses/Orbits: Mild mucosal thickening is present in the right maxillary sinus posterior right ethmoid air cells. There is mucosal thickening of the left maxillary sinus. The globes and orbits are within normal limits. MRA HEAD FINDINGS The internal carotid arteries are within normal limits from the high cervical segments through the ICA termini. The A1 M1 segments are normal. MCA bifurcations are intact bilaterally. ACA and MCA branch vessels are. The vertebral arteries are codominant. Left PICA origin is visualized and normal. The right AICA is dominant. Basilar artery is normal. Right posterior cerebral artery is of fetal type. A small P1 segment is normal. PCA branch vessels are normal bilaterally. IMPRESSION: 1. Scattered punctate subcortical T2 hyperintensities bilaterally are mildly advanced for age. The finding is nonspecific but can be seen in the setting of chronic microvascular ischemia, a demyelinating process such as multiple sclerosis, vasculitis, complicated migraine headaches, or as the sequelae of a prior infectious or inflammatory process. 2. No acute intracranial abnormality. 3. Normal MRA circle of Willis. No significant proximal stenosis, aneurysm, or branch vessel occlusion. Electronically Signed   By: San Morelle M.D.   On: 02/19/2019 14:32   Ct Head Code Stroke Wo Contrast`  Result  Date: 02/19/2019 CLINICAL DATA:  Code stroke. Left arm numbness beginning 30 minutes ago. EXAM: CT HEAD WITHOUT CONTRAST TECHNIQUE: Contiguous axial images were obtained from the base of the skull through the vertex without intravenous contrast. COMPARISON:  None. FINDINGS: Brain: There is no evidence of acute infarct, intracranial hemorrhage, mass, midline shift, or extra-axial fluid collection. The ventricles and sulci are normal. A well-defined subcentimeter hypodensity laterally in the left cerebellar hemisphere is most compatible with a chronic infarct. Vascular: Calcified atherosclerosis at the skull base, advanced for age. No hyperdense vessel. Skull: No fracture or suspicious osseous lesion. Sinuses/Orbits: Mild right posterior ethmoid air cell mucosal thickening. Clear mastoid air cells. Unremarkable orbits. Other: None. ASPECTS [facility] Stroke Program Early CT Score) - Ganglionic level infarction (caudate, lentiform nuclei, internal capsule, insula, M1-M3 cortex): 7 - Supraganglionic infarction (M4-M6 cortex): 3 Total score (0-10 with 10 being normal): 10 IMPRESSION: 1. No evidence of acute intracranial abnormality. 2. ASPECTS is 10. 3. Small chronic cerebellar infarct. These results were called by telephone at the time of interpretation on 02/19/2019 at 10:47 am to Dr. Lenise Arena , who verbally acknowledged these results. Electronically Signed   By: Logan Bores M.D.   On: 02/19/2019 10:48   US Abdomen Limited Ruq  Result Date: 02/19/2019 CLINICAL DATA:  Elevated LFTs EXAM: ULTRASOUND ABDOMEN LIMITED RIGHT UPPER QUADRANT COMPARISON:  04/29/2016 FINDINGS: Gallbladder: Poor visualization due to difficulty penetrating the liver. No definitive cholelithiasis is seen. Common bile duct: Diameter: 7.3 mm. Liver: Coarsened increased echogenicity consistent with fatty infiltration. This is similar to that seen on prior MRI examination. Portal vein is patent on color Doppler imaging with normal direction  of blood flow  towards the liver. IMPRESSION: Fatty liver. No other focal abnormality is noted. Electronically Signed   By: Inez Catalina M.D.   On: 02/19/2019 14:23    Medications:  I have reviewed the patient's current medications. Prior to Admission:  Medications Prior to Admission  Medication Sig Dispense Refill Last Dose  . ibuprofen (ADVIL,MOTRIN) 200 MG tablet Take 400 mg by mouth every 6 (six) hours as needed (For headache or body aches.).   prn at prn  . omeprazole (PRILOSEC) 40 MG capsule Take 1 capsule (40 mg total) by mouth daily. 90 capsule 0 02/19/2019 at 0500  . rosuvastatin (CRESTOR) 10 MG tablet Take 1 tablet (10 mg total) by mouth daily. 90 tablet 3 02/19/2019 at 0500   Scheduled: . aspirin  81 mg Oral Daily  . atorvastatin  40 mg Oral q1800  . buPROPion  150 mg Oral BID  . enoxaparin (LOVENOX) injection  40 mg Subcutaneous Q24H  . pantoprazole  40 mg Oral Daily   Assessment: 48 y.o. male with past medical history of coronary artery disease, hypertension, migraine headaches and GERD presenting to the ED today with chief complaints of left hand numbness and tingling sensation since 9:30 am.    Initial concerns for ischemic event however CT head and follow-up MRI of the brain did not show acute infarct.  MRI of brain showed scattered punctate subcortical T2 hyperintensity bilaterally.  Given his history of migraine headaches and chronic changes on MRI of the brain, differential include complicated migraine however cannot entirely rule out TIA. [place] did not show evidence of hemodynamically significant stenosis bilaterally.  Hemoglobin A1c 5.7, LDL 108.   Plan: 1. Continue medical management with Aspirin 81 mg/day  with intensive management of vascular risk factor to keep systolic BP (SBP) <256 mm Hg (130 mm Hg if diabetic). 2.  Optimize statin goal low density lipoprotein (LDL) <70 mg/dl 3.  Start magnesium 500 mg once a day for migraine prevention 4.  Follow-up with outpatient  neurology for management of migraine headaches   This patient was staffed with Dr. Irish Elders, Alease Frame who personally evaluated patient, reviewed documentation and agreed with assessment and plan of care as above.  Rufina Falco, DNP, FNP-BC Board certified Nurse Practitioner Neurology Department   LOS: 0 days   02/20/2019  10:14 AM

## 2019-02-20 NOTE — Progress Notes (Signed)
Pt has been discharged home. Discharge papers given and explained to pt. Pt verbalized understanding. Meds and f/u appointments reviewed. Rx given.

## 2019-02-27 ENCOUNTER — Other Ambulatory Visit: Payer: Self-pay | Admitting: Physician Assistant

## 2019-02-27 DIAGNOSIS — K219 Gastro-esophageal reflux disease without esophagitis: Secondary | ICD-10-CM

## 2019-03-01 ENCOUNTER — Other Ambulatory Visit: Payer: Self-pay

## 2019-03-01 ENCOUNTER — Encounter: Payer: Self-pay | Admitting: Physician Assistant

## 2019-03-01 ENCOUNTER — Ambulatory Visit (INDEPENDENT_AMBULATORY_CARE_PROVIDER_SITE_OTHER): Payer: Managed Care, Other (non HMO) | Admitting: Physician Assistant

## 2019-03-01 VITALS — BP 136/85 | HR 73 | Temp 97.6°F | Resp 16 | Ht 72.0 in | Wt 235.6 lb

## 2019-03-01 DIAGNOSIS — G43809 Other migraine, not intractable, without status migrainosus: Secondary | ICD-10-CM

## 2019-03-01 DIAGNOSIS — R7303 Prediabetes: Secondary | ICD-10-CM | POA: Insufficient documentation

## 2019-03-01 DIAGNOSIS — E785 Hyperlipidemia, unspecified: Secondary | ICD-10-CM | POA: Diagnosis not present

## 2019-03-01 DIAGNOSIS — Z09 Encounter for follow-up examination after completed treatment for conditions other than malignant neoplasm: Secondary | ICD-10-CM | POA: Diagnosis not present

## 2019-03-01 DIAGNOSIS — G43909 Migraine, unspecified, not intractable, without status migrainosus: Secondary | ICD-10-CM | POA: Insufficient documentation

## 2019-03-01 MED ORDER — ROSUVASTATIN CALCIUM 20 MG PO TABS: 20.0000 mg | ORAL_TABLET | Freq: Every day | ORAL | 1 refills | Status: DC

## 2019-03-01 NOTE — Patient Instructions (Signed)

## 2019-03-01 NOTE — Progress Notes (Signed)
Patient: Todd Massey Male    DOB: [birth year]   48 y.o.   MRN: [id number] Visit Date: 03/01/2019  Today's Provider: Trinna Post, PA-C   Chief Complaint  Patient presents with  . Follow-up   Subjective:     HPI  Follow up Hospitalization  Patient was admitted to [facility] on 02/19/2019 and discharged on 02/20/2019. He was treated for left arm numbness. Treatment for this included CVA work-up which essentially was unremarmarkable. MRI is consistent with migraine pathology. He reports excellent compliance with treatment, which includes magnesium 500 mg daily and ASA. He has follow up with Dr. Melrose Nakayama in [facility] on 03/06/2019. He reports this condition is Improved.  Patient is a 48 y/o man with history of obesity, tobacco abuse, and HLD. His initial CT head was negative for acute infarct.   IMPRESSION: 1. No evidence of acute intracranial abnormality. 2. ASPECTS is 10. 3. Small chronic cerebellar infarct.  He then underwent MRI/MRA Brain which showed the following.   IMPRESSION: 1. Scattered punctate subcortical T2 hyperintensities bilaterally are mildly advanced for age. The finding is nonspecific but can be seen in the setting of chronic microvascular ischemia, a demyelinating process such as multiple sclerosis, vasculitis, complicated migraine headaches, or as the sequelae of a prior infectious or inflammatory process. 2. No acute intracranial abnormality. 3. Normal MRA circle of Willis. No significant proximal stenosis, aneurysm, or branch vessel occlusion.  Echocardiogram was normal and carotid dopplers showed minimal atherosclerosis. Cholesterol has improved since starting crestor. Liver enzymes remain elevated and patient underwent ultrasound which showed fatty liver. GGT continues to remain elevated. Patient has had extensive workup for liver enzymes before per his report and does not desire to pursue this anymore. He has stopped drinking mountain  dew and he has lost 10 lbs. Continues to smoke 1/2 pack per day, says he is "deciding when to take it."   Wt Readings from Last 3 Encounters:  03/01/19 235 lb 9.6 oz (106.9 kg)  02/19/19 236 lb (107 kg)  01/30/19 240 lb 9.6 oz (109.1 kg)   HLD: Currently taking 10 mg crestor.  Lipid Panel     Component Value Date/Time   CHOL 194 02/20/2019 0354   CHOL 336 (H) 12/27/2018 1510   TRIG 258 (H) 02/20/2019 0354   HDL 34 (L) 02/20/2019 0354   HDL 40 12/27/2018 1510   CHOLHDL 5.7 02/20/2019 0354   VLDL 52 (H) 02/20/2019 0354   LDLCALC 108 (H) 02/20/2019 0354   LDLCALC 226 (H) 12/27/2018 1510    ------------------------------------------------------------------------------------   Allergies  Allergen Reactions  . Penicillins Rash and Other (See Comments)    Has patient had a PCN reaction causing immediate rash, facial/tongue/throat swelling, SOB or lightheadedness with hypotension: yes Has patient had a PCN reaction causing severe rash involving mucus membranes or skin necrosis: no Has patient had a PCN reaction that required hospitalization no Has patient had a PCN reaction occurring within the last 10 years: no If all of the above answers are "NO", then may proceed with Cephalosporin use.      Current Outpatient Medications:  .  aspirin 81 MG chewable tablet, Chew 1 tablet (81 mg total) by mouth daily., Disp: 120 tablet, Rfl: 0 .  ibuprofen (ADVIL,MOTRIN) 200 MG tablet, Take 400 mg by mouth every 6 (six) hours as needed (For headache or body aches.)., Disp: , Rfl:  .  Magnesium 500 MG CAPS, Take 1 capsule (500 mg total) by mouth daily.,  Disp: 120 capsule, Rfl: 0 .  omeprazole (PRILOSEC) 40 MG capsule, TAKE 1 CAPSULE BY MOUTH  DAILY, Disp: 90 capsule, Rfl: 0 .  rosuvastatin (CRESTOR) 10 MG tablet, Take 1 tablet (10 mg total) by mouth daily., Disp: 90 tablet, Rfl: 3  Review of Systems  Constitutional: Negative.   Respiratory: Negative.   Cardiovascular: Negative.    Neurological: Negative.     Social History   Tobacco Use  . Smoking status: Current Every Day Smoker    Packs/day: 0.50    Years: 30.00    Pack years: 15.00    Types: Cigarettes  . Smokeless tobacco: Never Used  Substance Use Topics  . Alcohol use: No      Objective:   BP 136/85 (BP Location: Right Arm, Patient Position: Sitting, Cuff Size: Large)   Pulse 73   Temp 97.6 F (36.4 C) (Oral)   Resp 16   Ht 6' (1.829 m)   Wt 235 lb 9.6 oz (106.9 kg)   BMI 31.95 kg/m  Vitals:   03/01/19 0806  BP: 136/85  Pulse: 73  Resp: 16  Temp: 97.6 F (36.4 C)  TempSrc: Oral  Weight: 235 lb 9.6 oz (106.9 kg)  Height: 6' (1.829 m)     Physical Exam Constitutional:      Appearance: Normal appearance. He is obese.  Cardiovascular:     Rate and Rhythm: Normal rate and regular rhythm.     Heart sounds: Normal heart sounds.  Pulmonary:     Effort: Pulmonary effort is normal. No respiratory distress.  Skin:    General: Skin is warm and dry.  Neurological:     Mental Status: He is alert. Mental status is at baseline. He is disoriented.  Psychiatric:        Mood and Affect: Mood normal.        Behavior: Behavior normal.         Assessment & Plan    1. Hospital discharge follow-up  CVA workup negative in the hospital, thought to represent migraine. He has been started on magnesium for this as well as ASA and has follow up with Dr. Melrose Nakayama on 03/06/2019 which I have advised him may be virtual. I have reviewed his hospital summary and discharge and have reconciled his medications. Counseled that just because this event was not a stroke does not mean he will not have one. Counseled on quitting smoking.   2. Other migraine without status migrainosus, not intractable   3. Hyperlipidemia, unspecified hyperlipidemia type  Cholesterol improved, but not completely controlled. Increase crestor to 20 mg daily. Emphasized that if he has side effects he should NOT stop taking, we should  reduce the dose or figure out alternative schedule.  - rosuvastatin (CRESTOR) 20 MG tablet; Take 1 tablet (20 mg total) by mouth daily.  Dispense: 90 tablet; Refill: 1  4. Prediabetes  Improved, he has stopped drinking full calorie mountain dew and is working more on his food intake.  The entirety of the information documented in the History of Present Illness, Review of Systems and Physical Exam were personally obtained by me. Portions of this information were initially documented by Lynford Humphrey, CMA and reviewed by me for thoroughness and accuracy.   F/u 6 mo for HLD.      Trinna Post, PA-C  [facility]

## 2019-03-07 ENCOUNTER — Ambulatory Visit: Payer: Self-pay | Admitting: Physician Assistant

## 2019-06-18 ENCOUNTER — Other Ambulatory Visit: Payer: Self-pay | Admitting: Physician Assistant

## 2019-06-18 DIAGNOSIS — K219 Gastro-esophageal reflux disease without esophagitis: Secondary | ICD-10-CM

## 2019-06-20 IMAGING — CR DG LUMBAR SPINE COMPLETE 4+V
5 series · 5 of 5 positions shown · non-contrast
Comparison: None

CLINICAL DATA: Low back pain extending to LEFT lower extremity for
3 weeks

EXAM:
LUMBAR SPINE - COMPLETE 4+ VIEW

[l-spine ap]
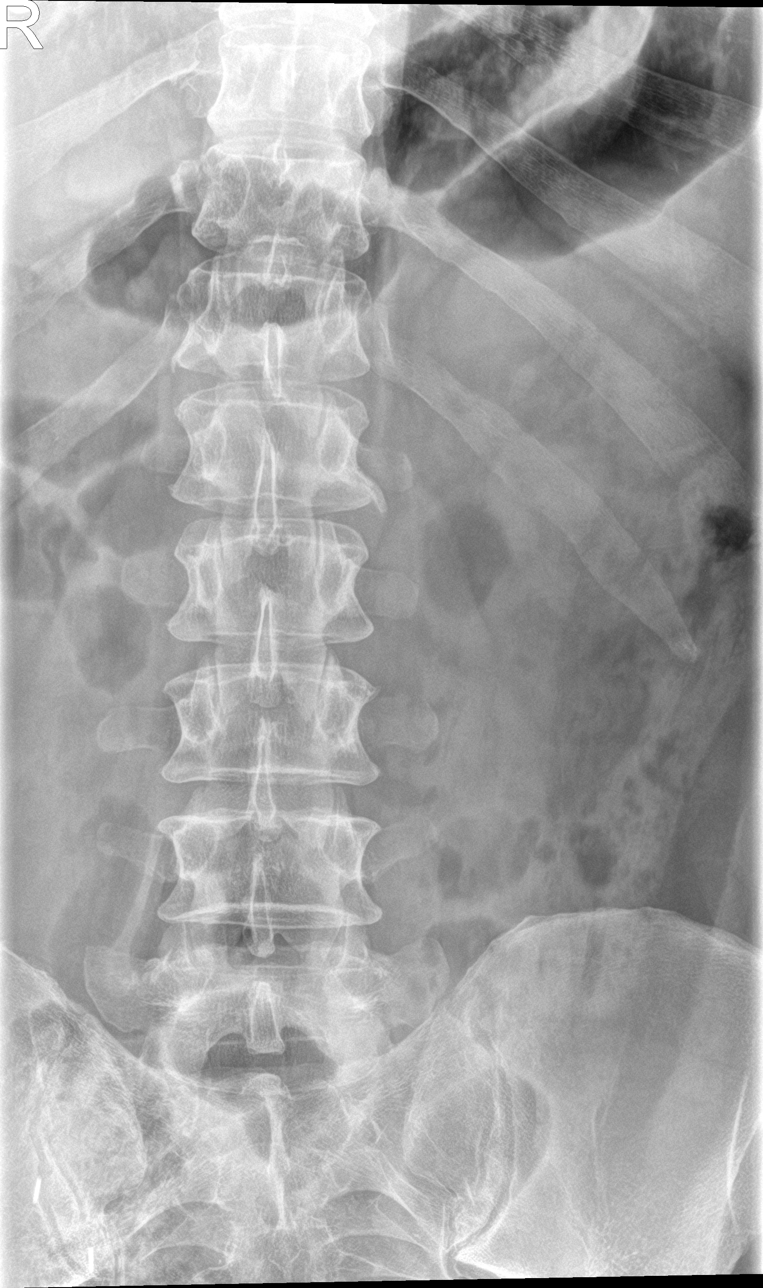

[l-spine obl (1 of 2)]
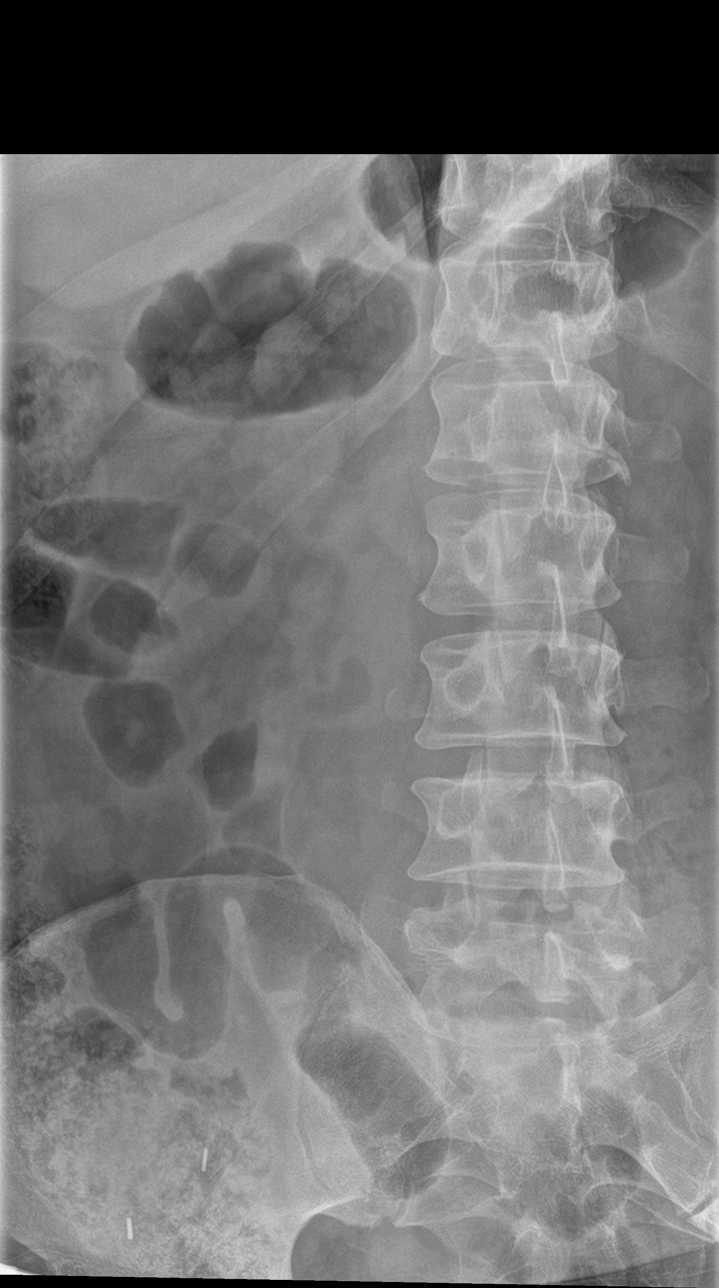

[l-spine obl (2 of 2)]
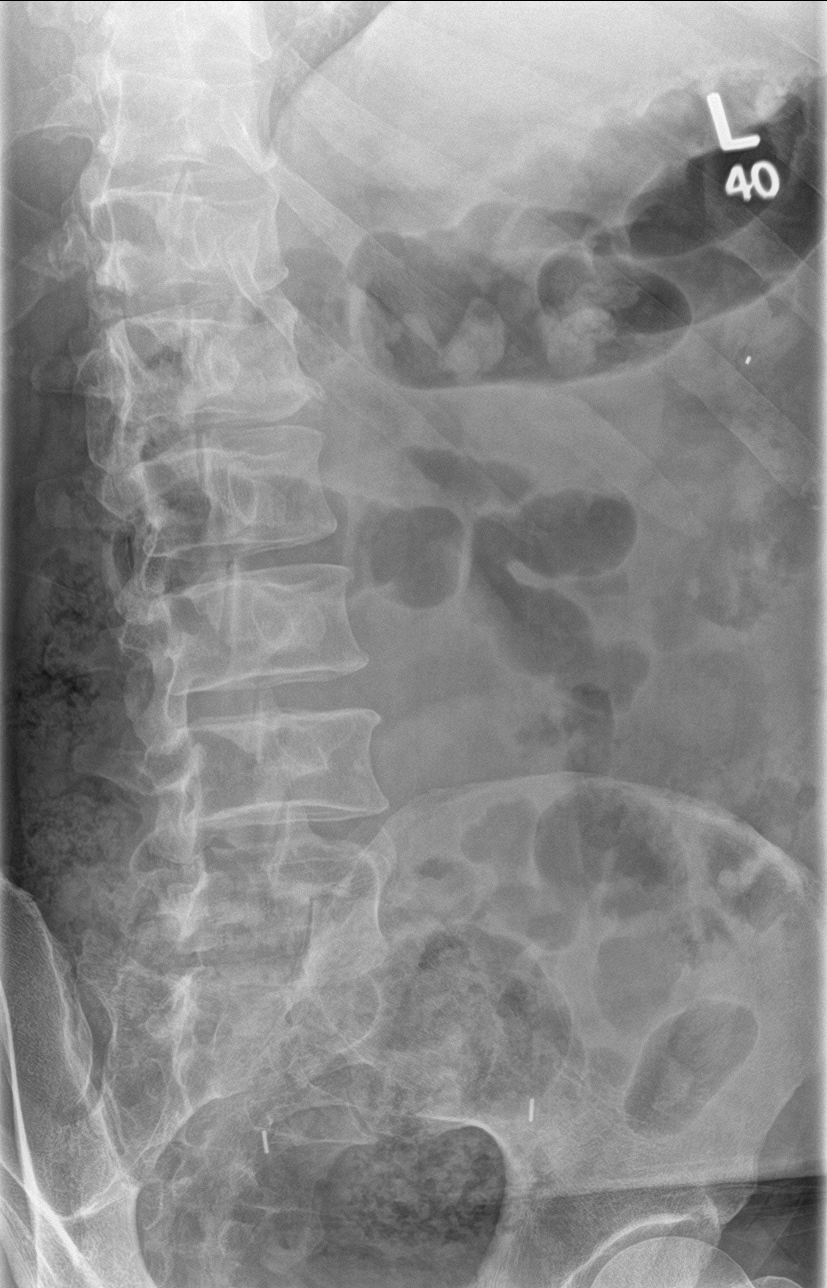

[l-spine lat]
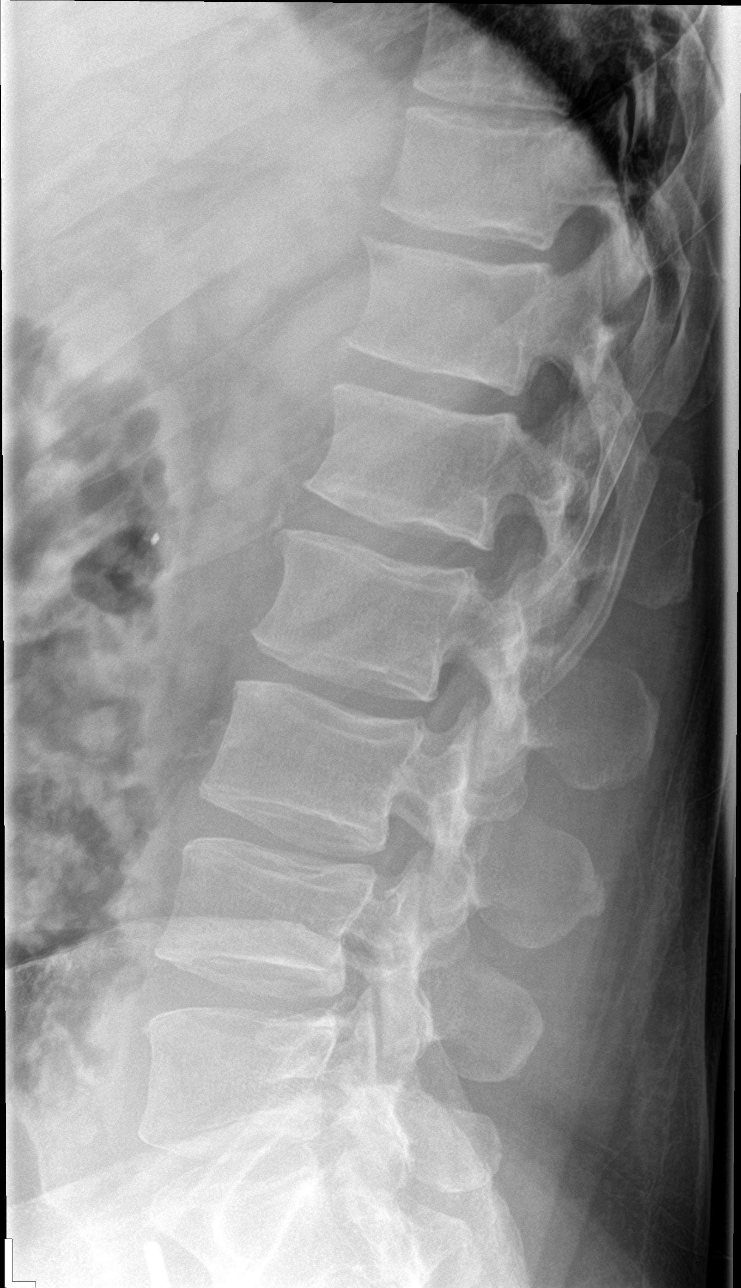

[l-spine spot]
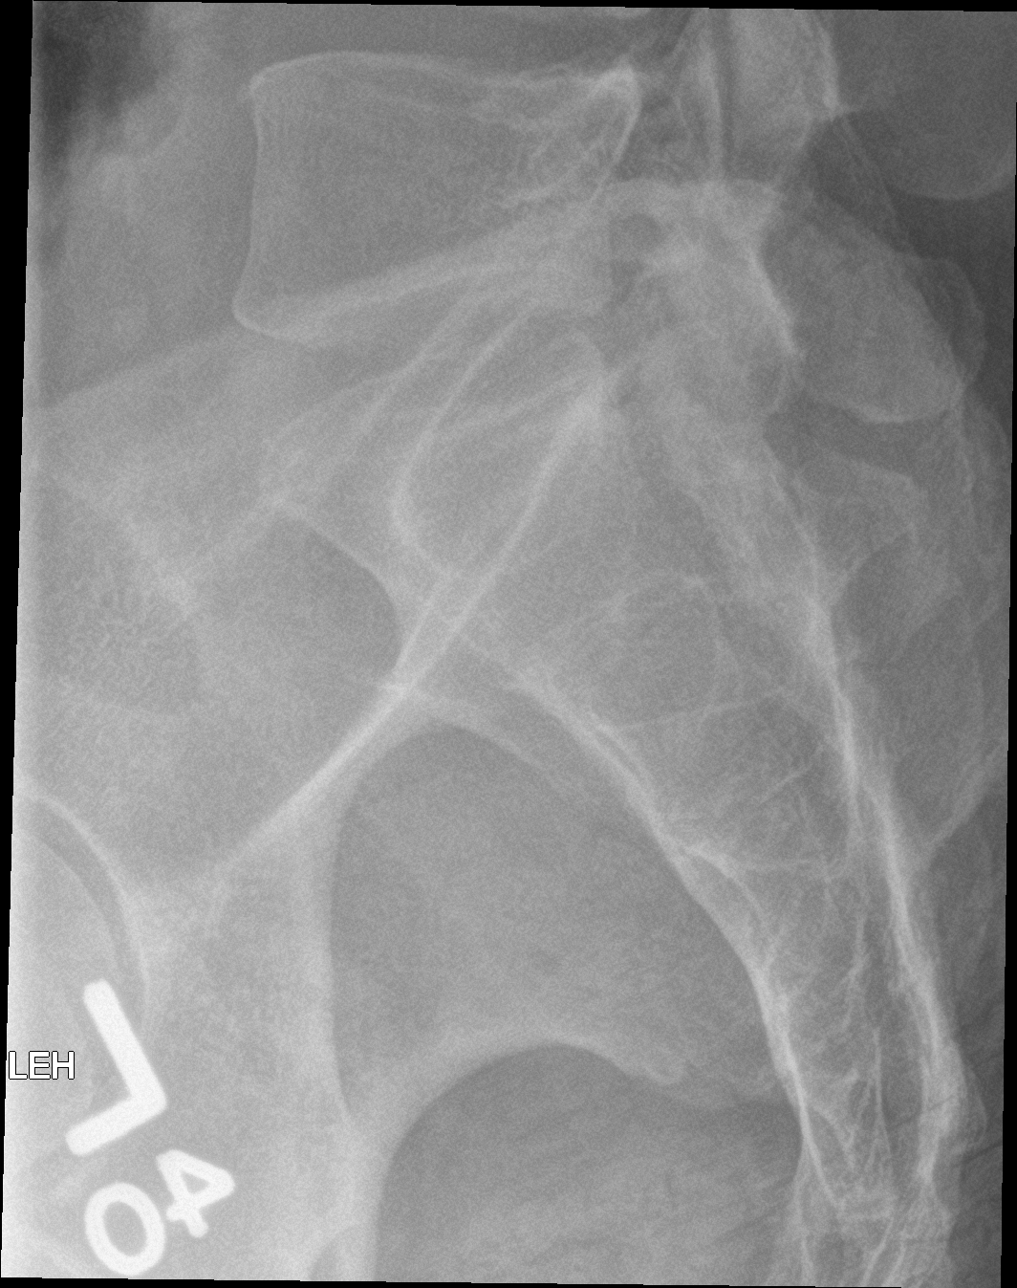

[5 of 5 positions shown; findings below may reference images not displayed]

FINDINGS: Five non-rib-bearing lumbar vertebra.

Osseous mineralization normal for technique.

Small inferior endplate spur L1.

Vertebral body and disc space heights maintained.

No acute fracture, subluxation or bone destruction.

SI joints preserved.
IMPRESSION: No acute abnormalities.

## 2019-06-27 ENCOUNTER — Other Ambulatory Visit: Payer: Self-pay | Admitting: Physician Assistant

## 2019-06-27 DIAGNOSIS — E785 Hyperlipidemia, unspecified: Secondary | ICD-10-CM

## 2019-07-06 ENCOUNTER — Encounter: Payer: Self-pay | Admitting: Physician Assistant

## 2019-08-30 NOTE — Progress Notes (Signed)
Patient: Todd Massey Male    DOB: 08-Aug-1971   48 y.o.   MRN: [id number] Visit Date: 08/31/2019  Today's Provider: Trinna Post, PA-C   Chief Complaint  Patient presents with  . Hyperlipidemia  . Hyperglycemia  . Follow-up   Subjective:     HPI   Other migraine without status migrainosus, not intractable From 03/01/2019-no changes.   Hyperlipidemia, unspecified hyperlipidemia type From 03/01/2019-Increased crestor to 20 mg daily. Emphasized that if he has side effects he should NOT stop taking, we should reduce the dose or figure out alternative schedule.   Lipid Panel     Component Value Date/Time   CHOL 194 02/20/2019 0354   CHOL 336 (H) 12/27/2018 1510   TRIG 258 (H) 02/20/2019 0354   HDL 34 (L) 02/20/2019 0354   HDL 40 12/27/2018 1510   CHOLHDL 5.7 02/20/2019 0354   VLDL 52 (H) 02/20/2019 0354   LDLCALC 108 (H) 02/20/2019 0354   LDLCALC 226 (H) 12/27/2018 1510   LABVLDL 70 (H) 12/27/2018 1510    Prediabetes From 03/01/2019-Improved. No changes.  He is drinking diet mountain dew but continues to drink sweetened tea.   Wt Readings from Last 3 Encounters:  08/31/19 241 lb 6.4 oz (109.5 kg)  03/01/19 235 lb 9.6 oz (106.9 kg)  02/19/19 236 lb (107 kg)     Lab Results  Component Value Date   HGBA1C 5.7 (H) 02/20/2019    Allergies  Allergen Reactions  . Penicillins Rash and Other (See Comments)    Has patient had a PCN reaction causing immediate rash, facial/tongue/throat swelling, SOB or lightheadedness with hypotension: yes Has patient had a PCN reaction causing severe rash involving mucus membranes or skin necrosis: no Has patient had a PCN reaction that required hospitalization no Has patient had a PCN reaction occurring within the last 10 years: no If all of the above answers are "NO", then may proceed with Cephalosporin use.      Current Outpatient Medications:  .  aspirin 81 MG chewable tablet, Chew 1 tablet (81 mg total) by mouth daily.,  Disp: 120 tablet, Rfl: 0 .  ibuprofen (ADVIL,MOTRIN) 200 MG tablet, Take 400 mg by mouth every 6 (six) hours as needed (For headache or body aches.)., Disp: , Rfl:  .  Magnesium 500 MG CAPS, Take 1 capsule (500 mg total) by mouth daily., Disp: 120 capsule, Rfl: 0 .  omeprazole (PRILOSEC) 40 MG capsule, TAKE 1 CAPSULE BY MOUTH  DAILY, Disp: 90 capsule, Rfl: 3 .  rosuvastatin (CRESTOR) 20 MG tablet, TAKE 1 TABLET BY MOUTH  DAILY, Disp: 90 tablet, Rfl: 1  Review of Systems  Constitutional: Negative for appetite change, chills and fever.  Respiratory: Negative for chest tightness, shortness of breath and wheezing.   Cardiovascular: Negative for chest pain and palpitations.  Gastrointestinal: Negative for abdominal pain, nausea and vomiting.    Social History   Tobacco Use  . Smoking status: Current Every Day Smoker    Packs/day: 0.50    Years: 30.00    Pack years: 15.00    Types: Cigarettes  . Smokeless tobacco: Never Used  Substance Use Topics  . Alcohol use: No      Objective:   BP 124/83 (BP Location: Left Arm, Patient Position: Sitting, Cuff Size: Large)   Pulse 67   Temp 98 F (36.7 C) (Temporal)   Resp 16   Ht 5\' 11"  (1.803 m)   Wt 241 lb 6.4 oz (109.5 kg)  BMI 33.67 kg/m  Vitals:   08/31/19 0812  BP: 124/83  Pulse: 67  Resp: 16  Temp: 98 F (36.7 C)  TempSrc: Temporal  Weight: 241 lb 6.4 oz (109.5 kg)  Height: 5\' 11"  (1.803 m)  Body mass index is 33.67 kg/m.   Physical Exam Constitutional:      Appearance: Normal appearance. He is obese.  Cardiovascular:     Rate and Rhythm: Normal rate and regular rhythm.     Heart sounds: Normal heart sounds.  Pulmonary:     Effort: Pulmonary effort is normal.     Breath sounds: Normal breath sounds.  Skin:    General: Skin is warm and dry.  Neurological:     Mental Status: He is alert and oriented to person, place, and time. Mental status is at baseline.  Psychiatric:        Mood and Affect: Mood normal.         Behavior: Behavior normal.      No results found for any visits on 08/31/19.     Assessment & Plan    1. Hyperlipidemia, unspecified hyperlipidemia type  Last visit crestor was increased to 20 mg daily and patient is doing well. Will check labs as below.  - Lipid Profile - HgB A1c  2. Prediabetes  Counseled on eliminating sweetened beverages, including tear, and reducing sugar in diet. Counseled that prediabetes can develop into diabetes over the next year.   - Lipid Profile - HgB A1c  The entirety of the information documented in the History of Present Illness, Review of Systems and Physical Exam were personally obtained by me. Portions of this information were initially documented by Lynford Humphrey, CMA and reviewed by me for thoroughness and accuracy.      Trinna Post, PA-C  [facility]

## 2019-08-31 ENCOUNTER — Other Ambulatory Visit: Payer: Self-pay

## 2019-08-31 ENCOUNTER — Ambulatory Visit (INDEPENDENT_AMBULATORY_CARE_PROVIDER_SITE_OTHER): Payer: Managed Care, Other (non HMO) | Admitting: Physician Assistant

## 2019-08-31 VITALS — BP 124/83 | HR 67 | Temp 98.0°F | Resp 16 | Ht 71.0 in | Wt 241.4 lb

## 2019-08-31 DIAGNOSIS — E785 Hyperlipidemia, unspecified: Secondary | ICD-10-CM | POA: Diagnosis not present

## 2019-08-31 DIAGNOSIS — R7303 Prediabetes: Secondary | ICD-10-CM | POA: Diagnosis not present

## 2019-08-31 NOTE — Patient Instructions (Signed)
Lipid Profile Test  Why am I having this test?  The lipid profile test can be used to help evaluate your risk for developing heart disease. The test is also used to monitor your levels during treatment for high cholesterol to see if you are reaching your goals.  What is being tested?  A lipid profile measures the following:   Total cholesterol. Cholesterol is a waxy, fat-like substance in your blood. If your total cholesterol level is high, this can increase your risk for heart disease.   High-density lipoprotein (HDL). This is known as the good cholesterol. Having high levels of HDL decreases your risk for heart disease. Your HDL level may be low if you smoke or do not get enough exercise.   Low-density lipoprotein (LDL). This is known as the bad cholesterol. This type causes plaque to build up in your arteries. Having a low level of LDL is best. Having high levels of LDL increases your risk for heart disease.   Cholesterol to HDL ratio. This is calculated by dividing your total cholesterol by your HDL cholesterol. The ratio is used by health care providers to determine your risk for heart disease. A low ratio is best.   Triglycerides. These are fats that your body can store or burn for energy. Low levels are best. Having high levels of triglycerides increases your risk for heart disease.  What kind of sample is taken?    A blood sample is required for this test. It is usually collected by inserting a needle into a blood vessel.  How do I prepare for this test?  Do not drink alcohol starting at least 24 hours before your test.  Follow any instructions from your health care provider about dietary restrictions before your test.  Do not eat or drink anything other than water after midnight on the night before the test, or as told by your health care provider.  Tell a health care provider about:   All medicines you are taking, including vitamins, herbs, eye drops, creams, and over-the-counter medicines.   Any  medical conditions you have.   Whether you are pregnant or may be pregnant.  How are the results reported?  Your test results will be reported as values that indicate your cholesterol and triglyceride levels. Your health care provider will compare your results to normal ranges that were established after testing a large group of people (reference ranges). Reference ranges may vary among labs and hospitals. For this test, common reference ranges are:  Total cholesterol   Adult or elderly: less than 200 mg/dL.   Child: 120-200 mg/dL.   Infant: 70-175 mg/dL.   Newborn: 53-135 mg/dL.  HDL   Male: greater than 45 mg/dL.   Male: greater than 55 mg/dL.  HDL reference values based on your risk for heart disease:   Low risk for heart disease:  ? Male: 60 mg/dL.  ? Male: 70 mg/dL.   Moderate risk for heart disease:  ? Male: 45 mg/dL.  ? Male: 55 mg/dL.   High risk for heart disease:  ? Male: 25 mg/dL.  ? Male: 35 mg/dL.  LDL   Adults: Your health care provider will determine a target level for LDL based on your risk for heart disease.  ? If you are at low risk, your LDL should be 130 mg/dL or less.  ? If you are at moderate risk, your LDL should be 100 mg/dL or less.  ? If you are at high risk, your LDL should   Male: 4.4.  Risk that is two times average (moderate risk): ? Male: 10.0. ? Male: 7.0.  Risk that is three times average (high risk): ? Male: 24.0. ? Male: 11.0. Triglycerides  Adult or elderly: ? Male: 40-160 mg/dL. ? Male: 35-135 mg/dL.  Children 59-86 years old: ? Male: 40-163 mg/dL. ? Male: 40-128 mg/dL.  Children 21-50 years old: ? Male: 36-138 mg/dL. ? Male: 41-138 mg/dL.  Children 15-74 years old: ? Male: 31-108  mg/dL. ? Male: 35-114 mg/dL.  Children 15-65 years old: ? Male: 30-86 mg/dL. ? Male: 32-99 mg/dL. Triglycerides should be less than 400 mg/dL even when you are not fasting. What do the results mean? Results that are within the reference ranges are considered normal. Total cholesterol, LDL, and triglyceride levels that are higher than the reference ranges can mean that you have an increased risk for heart disease. An HDL level that is lower than the reference range can also indicate an increased risk. Talk with your health care provider about what your results mean. Questions to ask your health care provider Ask your health care provider, or the department that is doing the test:  When will my results be ready?  How will I get my results?  What are my treatment options?  What other tests do I need?  What are my next steps? Summary  The lipid profile test can be used to help predict the likelihood that you will develop heart disease. It can also help monitor your cholesterol levels during treatment.  A lipid profile measures your total cholesterol, high-density lipoprotein (HDL), low-density lipoprotein (LDL), cholesterol to HDL ratio, and triglycerides.  Total cholesterol, LDL, and triglyceride levels that are higher than the reference ranges can indicate an increased risk for heart disease.  An HDL level that is lower than the reference range can indicate an increased risk for heart disease.  Talk with your health care provider about what your results mean. This information is not intended to replace advice given to you by your health care provider. Make sure you discuss any questions you have with your health care provider. Document Released: 12/02/2004 Document Revised: 08/15/2017 Document Reviewed: 08/15/2017 Elsevier Patient Education  2020 [employer].

## 2019-09-01 LAB — LIPID PANEL
Chol/HDL Ratio: 6.3 ratio — ABNORMAL HIGH (ref 0.0–5.0)
Cholesterol, Total: 215 mg/dL — ABNORMAL HIGH (ref 100–199)
HDL: 34 mg/dL — ABNORMAL LOW (ref >39)
LDL Chol Calc (NIH): 133 mg/dL — ABNORMAL HIGH (ref 0–99)
Triglycerides: 269 mg/dL — ABNORMAL HIGH (ref 0–149)
VLDL Cholesterol Cal: 48 mg/dL — ABNORMAL HIGH (ref 5–40)

## 2019-09-01 LAB — HEMOGLOBIN A1C
Est. average glucose Bld gHb Est-mCnc: 123 mg/dL
Hgb A1c MFr Bld: 5.9 % — ABNORMAL HIGH (ref 4.8–5.6)

## 2019-12-06 ENCOUNTER — Other Ambulatory Visit: Payer: Self-pay | Admitting: Physician Assistant

## 2019-12-06 DIAGNOSIS — E785 Hyperlipidemia, unspecified: Secondary | ICD-10-CM

## 2020-04-12 ENCOUNTER — Other Ambulatory Visit: Payer: Self-pay | Admitting: Physician Assistant

## 2020-04-12 DIAGNOSIS — K219 Gastro-esophageal reflux disease without esophagitis: Secondary | ICD-10-CM

## 2020-04-12 NOTE — Telephone Encounter (Signed)
Requested Prescriptions  Pending Prescriptions Disp Refills  . omeprazole (PRILOSEC) 40 MG capsule [Pharmacy Med Name: OMEPRAZOLE  40MG   CAP] 90 capsule 1    Sig: TAKE 1 CAPSULE BY MOUTH  DAILY     Gastroenterology: Proton Pump Inhibitors Passed - 04/12/2020 10:14 PM      Passed - Valid encounter within last 12 months    Recent Outpatient Visits          7 months ago Hyperlipidemia, unspecified hyperlipidemia type   [facility], [place], [place]   1 year ago Hospital discharge follow-up   [place], [place], [place]   1 year ago Right low back pain, unspecified chronicity, unspecified whether sciatica present   [place], PA-C   1 year ago Hyperlipidemia, unspecified hyperlipidemia type   [place], [place], [place]

## 2020-04-22 ENCOUNTER — Other Ambulatory Visit: Payer: Self-pay

## 2020-04-22 ENCOUNTER — Ambulatory Visit: Payer: Self-pay | Admitting: Physician Assistant

## 2020-04-22 ENCOUNTER — Ambulatory Visit: Payer: Managed Care, Other (non HMO) | Admitting: Physician Assistant

## 2020-04-22 ENCOUNTER — Encounter: Payer: Self-pay | Admitting: Physician Assistant

## 2020-04-22 VITALS — BP 122/84 | HR 72 | Temp 97.2°F | Wt 242.2 lb

## 2020-04-22 DIAGNOSIS — R42 Dizziness and giddiness: Secondary | ICD-10-CM | POA: Diagnosis not present

## 2020-04-22 DIAGNOSIS — E785 Hyperlipidemia, unspecified: Secondary | ICD-10-CM

## 2020-04-22 DIAGNOSIS — R6882 Decreased libido: Secondary | ICD-10-CM | POA: Diagnosis not present

## 2020-04-22 DIAGNOSIS — R569 Unspecified convulsions: Secondary | ICD-10-CM | POA: Diagnosis not present

## 2020-04-22 DIAGNOSIS — Z720 Tobacco use: Secondary | ICD-10-CM

## 2020-04-22 MED ORDER — ROSUVASTATIN CALCIUM 40 MG PO TABS: 40.0000 mg | ORAL_TABLET | Freq: Every day | ORAL | 3 refills | Status: DC

## 2020-04-22 NOTE — Telephone Encounter (Signed)
Patient had appointment this morning,04/22/2020.

## 2020-04-22 NOTE — Telephone Encounter (Signed)
Pt reports dizziness "For years", worsening 2 weeks ago, occurs every other day. States yesterday "Passed out." Reports felt a "Numbness" prior to episode. Fell from standing position on grass, denies any obvious head injury. Witnessed, girl friend states unresponsive for 1-1/2 minutes, "Shaking with eyes rolled back", mouth contorted. No incontinence.  Called EMS, pt refused to go to ED. States EMS suggested he call PCP to schedule CT scan. VS at that time sitting, 167/95  HR 111  Standing  146/88  HR 99.  BS 105. Denies any symptoms presently. Appt made with PCP for today at [place] ED if ANY dizziness, numbness, severe headache occurs,  Any symptoms worsen. Pt verbalizes understanding.  Reason for Disposition . [1] MODERATE dizziness (e.g., vertigo; feels very unsteady, interferes with normal activities) AND [2] has NOT been evaluated by physician for this  Answer Assessment - Initial Assessment Questions 1. DESCRIPTION: "Describe your dizziness."     Spinning 2. VERTIGO: "Do you feel like either you or the room is spinning or tilting?"       3. LIGHTHEADED: "Do you feel lightheaded?" (e.g., somewhat faint, woozy, weak upon standing)     4. SEVERITY: "How bad is it?"  "Can you walk?"   - MILD - Feels unsteady but walking normally.   - MODERATE - Feels very unsteady when walking, but not falling; interferes with normal activities (e.g., school, work) .   - SEVERE - Unable to walk without falling (requires assistance).     Passed out yesterday 5. ONT:  "When did the dizziness begin?"     Years ago, worsening 2 weeks 6. AGGRAVATING FACTORS: "Does anything make it worse?" (e.g., standing, change in head position)    no 7. CAUSE: "What do you think is causing the dizziness?"     unsure 8. RECURRENT SYMPTOM: "Have you had dizziness before?" If so, ask: "When was the last time?" "What happened that time?"     2 weeks ago 9. OTHER SYMPTOMS: "Do you have any other symptoms?" (e.g.,  headache, weakness, numbness, vomiting, earache)     "Numbness"  Protocols used: DIZZINESS - VERTIGO-A-AH

## 2020-04-22 NOTE — Progress Notes (Signed)
Established patient visit   Patient: Todd Massey   DOB: 1971/05/20   49 y.o. Male  MRN: [id number] Visit Date: 04/22/2020  Today's healthcare provider: Trinna Post, PA-C   Chief Complaint  Patient presents with  . Dizziness  I,Sayeed Weatherall M Nisaiah Bechtol,acting as a scribe for Trinna Post, PA-C.,have documented all relevant documentation on the behalf of Trinna Post, PA-C,as directed by  Trinna Post, PA-C while in the presence of Trinna Post, PA-C.  Subjective    Dizziness This is a recurrent problem. The current episode started yesterday. The problem occurs intermittently. The problem has been unchanged. Associated symptoms include fatigue, headaches and weakness. Pertinent negatives include no chills, coughing, nausea, vertigo, visual change or vomiting. The symptoms are aggravated by bending, standing and walking. He has tried nothing for the symptoms. The treatment provided no relief.    Patient reports he has "dizzy spells" for 8-9 years. Reports history of head injury at 49 year old. Since then he had migraines and was told he would have migraines due to head injury. Reports he feels limp and weak several times a week. Reports he was in the yard and began to feel limp and weak. Ultimately he lost consciousness and then woke up after a minute and half. Reports weather was not hot and he was drinking and eating OK. Wife reports when he was on the ground his eyes were open and he was "twitching." When patient came to, reports he could not respond but was able to see at that point. Reports after a couple of minutes he felt better and not dizzy or numb. EMS came out to the house and took an EKG which patient reports was normal. Declines EKG today. Reports blood pressure was 169/65. Blood sugar 105. O2 sats 88% initially when EMS came. Saturations came back up in the back of the ambulance. No alcohol or drugs. Today patient feels fatigued, sleep and as if he has no energy. No  trouble speaking, seeing, or understanding anybody. No falling or balance issues. Reports he did have some mild dizzy spells after this. Patient reports he would like a CT scan as EMS recommended this.   Patient reports mood swings and low sex drive. Would like his testosterone checked.   Lipid/Cholesterol, Follow-up  Last lipid panel Other pertinent labs  Lab Results  Component Value Date   CHOL 215 (H) 08/31/2019   HDL 34 (L) 08/31/2019   LDLCALC 133 (H) 08/31/2019   TRIG 269 (H) 08/31/2019   CHOLHDL 6.3 (H) 08/31/2019   Lab Results  Component Value Date   ALT 59 (H) 02/19/2019   AST 38 02/19/2019   PLT 188 02/19/2019   TSH 1.780 12/27/2018     He was last seen for this 6 months ago.  Management since that visit includes crestor 20 mg.  He reports good compliance with treatment. He is not having side effects.   Symptoms: No chest pain No chest pressure/discomfort  No dyspnea No lower extremity edema  No numbness or tingling of extremity No orthopnea  No palpitations No paroxysmal nocturnal dyspnea  No speech difficulty No syncope   Current diet: not asked Current exercise: none  The 10-year ASCVD risk score Mikey Bussing DC Jr., et al., 2013) is: 11%  Asks about using Wellbutrin for smoking cessation currently.  ---------------------------------------------------------------------------------------------------         Medications: Outpatient Medications Prior to Visit  Medication Sig  . aspirin 81 MG chewable tablet Chew  1 tablet (81 mg total) by mouth daily.  Marland Kitchen ibuprofen (ADVIL,MOTRIN) 200 MG tablet Take 400 mg by mouth every 6 (six) hours as needed (For headache or body aches.).  Marland Kitchen Magnesium 500 MG CAPS Take 1 capsule (500 mg total) by mouth daily.  Marland Kitchen omeprazole (PRILOSEC) 40 MG capsule TAKE 1 CAPSULE BY MOUTH  DAILY  . [DISCONTINUED] rosuvastatin (CRESTOR) 20 MG tablet TAKE 1 TABLET BY MOUTH  DAILY   No facility-administered medications prior to visit.     Review of Systems  Constitutional: Positive for fatigue. Negative for chills.  Respiratory: Negative.  Negative for cough.   Cardiovascular: Negative.   Gastrointestinal: Negative for nausea and vomiting.  Neurological: Positive for dizziness, weakness, light-headedness and headaches. Negative for vertigo.  Hematological: Negative.       Objective    BP 122/84 (BP Location: Right Arm, Patient Position: Sitting, Cuff Size: Normal)   Pulse 72   Temp (!) 97.2 F (36.2 C) (Temporal)   Wt 242 lb 3.2 oz (109.9 kg)   SpO2 97%   BMI 33.78 kg/m    Physical Exam Constitutional:      Appearance: Normal appearance.  Eyes:     Extraocular Movements: Extraocular movements intact.     Pupils: Pupils are equal, round, and reactive to light.  Cardiovascular:     Rate and Rhythm: Normal rate and regular rhythm.     Heart sounds: Normal heart sounds.  Pulmonary:     Effort: Pulmonary effort is normal.     Breath sounds: Normal breath sounds.  Skin:    General: Skin is warm and dry.  Neurological:     General: No focal deficit present.     Mental Status: He is alert and oriented to person, place, and time. Mental status is at baseline.     Cranial Nerves: No cranial nerve deficit.     Sensory: No sensory deficit.     Motor: No weakness.     Coordination: Coordination normal.     Gait: Gait normal.     Deep Tendon Reflexes: Reflexes normal.  Psychiatric:        Mood and Affect: Mood normal.        Behavior: Behavior normal.       No results found for any visits on 04/22/20.  Assessment & Plan    1. Dizziness  Patient with seizure like activity yesterday, declined EMS transport at the time. Patient with previous head injury and chronic migraines with nonspecific changes on MRI last year. Patient does not have current neurologist. Will check labs as below and refer urgently to neurology for seizure workup. Counseled that we can certainly get CT scan of his head, however, the  window has passed for stroke management and I think it unlikely he has a mass causing this episode that has emerged within the past year. He may benefit more from seeing the neurologist and getting more targeted imaging such as MRI. Offered to get CT scan but patient declines and would like to see neurologist first.   - Ambulatory referral to Neurology  2. Seizure-like activity ([place])  - Ambulatory referral to Neurology - Comprehensive Metabolic Panel (CMET) - CBC with Differential - TSH  3. Low libido  Advised to return between 8-10 AM and repeat test. If low, would refer to urology.   - Testosterone,Free and Total - Testosterone,Free and Total; Future  4. Hyperlipidemia, unspecified hyperlipidemia type  Increase crestor to 40 mg QD. Patient did have some nausea previously  with increased dose but cholesterol is not controlled.   - rosuvastatin (CRESTOR) 40 MG tablet; Take 1 tablet (40 mg total) by mouth daily.  Dispense: 90 tablet; Refill: 3  5. Tobacco abuse  Advised against using Wellbutrin at this point due to lowering of seizure threshold.      No follow-ups on file.      ITrinna Post, PA-C, have reviewed all documentation for this visit. The documentation on 04/22/20 for the exam, diagnosis, procedures, and orders are all accurate and complete.     Paulene Floor  [facility] [phone] (phone) [phone] (fax)  [place]

## 2020-04-22 NOTE — Patient Instructions (Signed)
Seizure, Adult °A seizure is a sudden burst of abnormal electrical activity in the brain. Seizures usually last from 30 seconds to 2 minutes. They can cause many different symptoms. °Usually, seizures are not harmful unless they last a long time. °What are the causes? °Common causes of this condition include: °· Fever or infection. °· Conditions that affect the brain, such as: °? A brain abnormality that you were born with. °? A brain or head injury. °? Bleeding in the brain. °? A tumor. °? Stroke. °? Brain disorders such as autism or cerebral palsy. °· Low blood sugar. °· Conditions that are passed from parent to child (are inherited). °· Problems with substances, such as: °? Having a reaction to a drug or a medicine. °? Suddenly stopping the use of a substance (withdrawal). °In some cases, the cause may not be known. A person who has repeated seizures over time without a clear cause has a condition called epilepsy. °What increases the risk? °You are more likely to get this condition if you have: °· A family history of epilepsy. °· Had a seizure in the past. °· A brain disorder. °· A history of head injury, lack of oxygen at birth, or strokes. °What are the signs or symptoms? °There are many types of seizures. The symptoms vary depending on the type of seizure you have. Examples of symptoms during a seizure include: °· Shaking (convulsions). °· Stiffness in the body. °· Passing out (losing consciousness). °· Head nodding. °· Staring. °· Not responding to sound or touch. °· Loss of bladder control and bowel control. °Some people have symptoms right before and right after a seizure happens. °Symptoms before a seizure may include: °· Fear. °· Worry (anxiety). °· Feeling like you may vomit (nauseous). °· Feeling like the room is spinning (vertigo). °· Feeling like you saw or heard something before (déjà vu). °· Odd tastes or smells. °· Changes in how you see. You may see flashing lights or spots. °Symptoms after a  seizure happens can include: °· Confusion. °· Sleepiness. °· Headache. °· Weakness on one side of the body. °How is this treated? °Most seizures will stop on their own in under 5 minutes. In these cases, no treatment is needed. Seizures that last longer than 5 minutes will usually need treatment. Treatment can include: °· Medicines given through an IV tube. °· Avoiding things that are known to cause your seizures. These can include medicines that you take for another condition. °· Medicines to treat epilepsy. °· Surgery to stop the seizures. This may be needed if medicines do not help. °Follow these instructions at home: °Medicines °· Take over-the-counter and prescription medicines only as told by your doctor. °· Do not eat or drink anything that may keep your medicine from working, such as alcohol. °Activity °· Do not do any activities that would be dangerous if you had another seizure, like driving or swimming. Wait until your doctor says it is safe for you to do them. °· If you live in the [place] ask your local DMV (department of motor vehicles) when you can drive. °· Get plenty of rest. °Teaching others °Teach friends and family what to do when you have a seizure. They should: °· Lay you on the ground. °· Protect your head and body. °· Loosen any tight clothing around your neck. °· Turn you on your side. °· Not hold you down. °· Not put anything into your mouth. °· Know whether or not you need emergency care. °· Stay   with you until you are better. ° °General instructions °· Contact your doctor each time you have a seizure. °· Avoid anything that gives you seizures. °· Keep a seizure diary. Write down: °? What you think caused each seizure. °? What you remember about each seizure. °· Keep all follow-up visits as told by your doctor. This is important. °Contact a doctor if: °· You have another seizure. °· You have seizures more often. °· There is any change in what happens during your seizures. °· You keep having  seizures with treatment. °· You have symptoms of being sick or having an infection. °Get help right away if: °· You have a seizure that: °? Lasts longer than 5 minutes. °? Is different than seizures you had before. °? Makes it harder to breathe. °? Happens after you hurt your head. °· You have any of these symptoms after a seizure: °? Not being able to speak. °? Not being able to use a part of your body. °? Confusion. °? A bad headache. °· You have two or more seizures in a row. °· You do not wake up right after a seizure. °· You get hurt during a seizure. °These symptoms may be an emergency. Do not wait to see if the symptoms will go away. Get medical help right away. Call your local emergency services (911 in the [place] Do not drive yourself to the hospital. °Summary °· Seizures usually last from 30 seconds to 2 minutes. Usually, they are not harmful unless they last a long time. °· Do not eat or drink anything that may keep your medicine from working, such as alcohol. °· Teach friends and family what to do when you have a seizure. °· Contact your doctor each time you have a seizure. °This information is not intended to replace advice given to you by your health care provider. Make sure you discuss any questions you have with your health care provider. °Document Revised: 01/26/2019 Document Reviewed: 01/26/2019 °Elsevier Patient Education © 2020 [employer] ° °

## 2020-04-23 LAB — COMPREHENSIVE METABOLIC PANEL
ALT: 67 IU/L — ABNORMAL HIGH (ref 0–44)
AST: 51 IU/L — ABNORMAL HIGH (ref 0–40)
Albumin/Globulin Ratio: 1.8 (ref 1.2–2.2)
Albumin: 4.6 g/dL (ref 4.0–5.0)
Alkaline Phosphatase: 269 IU/L — ABNORMAL HIGH (ref 48–121)
BUN/Creatinine Ratio: 17 (ref 9–20)
BUN: 20 mg/dL (ref 6–24)
Bilirubin Total: 0.5 mg/dL (ref 0.0–1.2)
CO2: 21 mmol/L (ref 20–29)
Calcium: 9.8 mg/dL (ref 8.7–10.2)
Chloride: 103 mmol/L (ref 96–106)
Creatinine, Ser: 1.2 mg/dL (ref 0.76–1.27)
GFR calc Af Amer: 82 mL/min/{1.73_m2} (ref >59)
GFR calc non Af Amer: 71 mL/min/{1.73_m2} (ref >59)
Globulin, Total: 2.6 g/dL (ref 1.5–4.5)
Glucose: 84 mg/dL (ref 65–99)
Potassium: 4.9 mmol/L (ref 3.5–5.2)
Sodium: 139 mmol/L (ref 134–144)
Total Protein: 7.2 g/dL (ref 6.0–8.5)

## 2020-04-23 LAB — CBC WITH DIFFERENTIAL/PLATELET
Basophils Absolute: 0.2 10*3/uL (ref 0.0–0.2)
Basos: 2 %
EOS (ABSOLUTE): 0.4 10*3/uL (ref 0.0–0.4)
Eos: 4 %
Hematocrit: 51.3 % — ABNORMAL HIGH (ref 37.5–51.0)
Hemoglobin: 16.9 g/dL (ref 13.0–17.7)
Immature Grans (Abs): 0 10*3/uL (ref 0.0–0.1)
Immature Granulocytes: 0 %
Lymphocytes Absolute: 3.1 10*3/uL (ref 0.7–3.1)
Lymphs: 32 %
MCH: 28.7 pg (ref 26.6–33.0)
MCHC: 32.9 g/dL (ref 31.5–35.7)
MCV: 87 fL (ref 79–97)
Monocytes Absolute: 0.8 10*3/uL (ref 0.1–0.9)
Monocytes: 8 %
Neutrophils Absolute: 5.4 10*3/uL (ref 1.4–7.0)
Neutrophils: 54 %
Platelets: 219 10*3/uL (ref 150–450)
RBC: 5.89 x10E6/uL — ABNORMAL HIGH (ref 4.14–5.80)
RDW: 13.2 % (ref 11.6–15.4)
WBC: 9.8 10*3/uL (ref 3.4–10.8)

## 2020-04-23 LAB — TSH: TSH: 1.68 u[IU]/mL (ref 0.450–4.500)

## 2020-04-23 NOTE — Progress Notes (Signed)
Patient viewed message via MyChart. 

## 2020-04-28 ENCOUNTER — Encounter: Payer: Self-pay | Admitting: Neurology

## 2020-04-28 ENCOUNTER — Ambulatory Visit: Payer: Managed Care, Other (non HMO) | Admitting: Neurology

## 2020-04-28 ENCOUNTER — Other Ambulatory Visit: Payer: Self-pay

## 2020-04-28 VITALS — BP 115/79 | HR 82 | Ht 71.0 in | Wt 246.5 lb

## 2020-04-28 DIAGNOSIS — G40209 Localization-related (focal) (partial) symptomatic epilepsy and epileptic syndromes with complex partial seizures, not intractable, without status epilepticus: Secondary | ICD-10-CM

## 2020-04-28 MED ORDER — LAMOTRIGINE 25 MG PO TABS: ORAL_TABLET | ORAL | 0 refills | Status: DC

## 2020-04-28 MED ORDER — LAMOTRIGINE 100 MG PO TABS: 100.0000 mg | ORAL_TABLET | Freq: Two times a day (BID) | ORAL | 4 refills | Status: DC

## 2020-04-28 NOTE — Progress Notes (Signed)
PATIENT: Todd Massey DOB: 08-Apr-1971  Chief Complaint  Patient presents with  . Migraine    He is here with his significant other, Tabatha. Reports chronic daily headaches for years that vary in severity. He has history of head injury 25+ years ago. He has only tried daily magnesium supplements in the past for prevention. He gets some relief with ibuprofen.  . Seizure-like activity    On 04/19/20, he was outside pulling weeds. When he stood up, he felt dizzy then passed out. His significant other witnessed his eyes rolling upwards, upper body jerking, hands clenched and unable to respond verbally.  He also bit his tongue. The event lasted 90 seconds. EMS was called but he declined transportation to ED.  After the event, he felt uneasy and fatigued.  They reports several other episodes of staring blankly that occurred a few years ago.  Marland Kitchen PCP    Trinna Post, PA-C     HISTORICAL  Todd Massey is a 49 year old male, is accompanied by his significant other Tabatha, seen in request by his primary care PA Trinna Post for evaluation of chronic headaches, recurrent spells of transient dizziness, confusion, initial evaluation was on April 28, 2020.  I have reviewed and summarized the referring note from the referring physician.  He has past medical history of hyperlipidemia. Todd Massey has been with him for more than 5 years, as long as she could recall, patient has been complaining frequent headaches, dizziness transient confusion spells since she knows him.  The spells gradually getting worse, more frequent  He describes his spells as sudden onset overwhelming rushing throughout his body, he felt lightheaded, as if he is going to pass out, it most happened in a standing up position, but also happened in a sitting down position, it is often followed by bilateral frontal temporal region pressure headache last for a couple hours, he has been taking ibuprofen about 3 times each week for his  recurrent spells and headaches over the past 1 year  On Apr 19, 2020, he got up in the morning, had breakfast, around 1030am, he bended over to throw away some weight to the trash can, he suddenly felt the family a rushing sensation throughout his body, he has to hold on the trash can, then lost consciousness, Tabatha witnessed his arm curled up, body jerking movement last about 2 minutes, followed by postevent confusion, he had right lateral tongue biting, paramedic was called, by that time, he recovered back to baseline  His friend also reported witnessed episode of sudden onset stop talking, staring into space, lasting for couple minutes  I personally reviewed MRI of the brain without contrast in March 2020, scattered supratentorium small vessel disease, no acute abnormality, MRI of the brain showed no significant abnormality  Laboratory evaluation June 2021, normal TSH, CBC, hemoglobin of 16.9, CMP showed elevated alkaline phosphate, AST 51, ALT 67,   REVIEW OF SYSTEMS: Full 14 system review of systems performed and notable only for as above All other review of systems were negative.  ALLERGIES: Allergies  Allergen Reactions  . Penicillins Rash and Other (See Comments)    Has patient had a PCN reaction causing immediate rash, facial/tongue/throat swelling, SOB or lightheadedness with hypotension: yes Has patient had a PCN reaction causing severe rash involving mucus membranes or skin necrosis: no Has patient had a PCN reaction that required hospitalization no Has patient had a PCN reaction occurring within the last 10 years: no If all of the above  answers are "NO", then may proceed with Cephalosporin use.     HOME MEDICATIONS: Current Outpatient Medications  Medication Sig Dispense Refill  . aspirin 81 MG chewable tablet Chew 1 tablet (81 mg total) by mouth daily. 120 tablet 0  . ibuprofen (ADVIL,MOTRIN) 200 MG tablet Take 400 mg by mouth every 6 (six) hours as needed (For headache  or body aches.).    Marland Kitchen Magnesium 500 MG CAPS Take 1 capsule (500 mg total) by mouth daily. 120 capsule 0  . omeprazole (PRILOSEC) 40 MG capsule TAKE 1 CAPSULE BY MOUTH  DAILY 90 capsule 1  . rosuvastatin (CRESTOR) 40 MG tablet Take 1 tablet (40 mg total) by mouth daily. 90 tablet 3   No current facility-administered medications for this visit.    PAST MEDICAL HISTORY: Past Medical History:  Diagnosis Date  . Allergy   . GERD (gastroesophageal reflux disease)   . Headache    migraines  . Migraine   . Seizure-like activity ([place])     PAST SURGICAL HISTORY: Past Surgical History:  Procedure Laterality Date  . APPENDECTOMY    . EUS N/A 03/31/2016   Procedure: ESOPHAGEAL ENDOSCOPIC ULTRASOUND (EUS) RADIAL;  Surgeon: Arta Silence, MD;  Location: [facility];  Service: Endoscopy;  Laterality: N/A;    FAMILY HISTORY: Family History  Problem Relation Age of Onset  . Diabetes Mother   . Other Father        unsure of medical history    SOCIAL HISTORY: Social History   Socioeconomic History  . Marital status: Single    Spouse name: Not on file  . Number of children: 2  . Years of education: 67  . Highest education level: High school graduate  Occupational History  . Occupation: produce associate    Comment: Public house manager  Tobacco Use  . Smoking status: Current Every Day Smoker    Packs/day: 0.50    Years: 30.00    Pack years: 15.00    Types: Cigarettes  . Smokeless tobacco: Never Used  Substance and Sexual Activity  . Alcohol use: No  . Drug use: Never  . Sexual activity: Not on file  Other Topics Concern  . Not on file  Social History Narrative   Lives at home with significant other.   Right-handed.   One 16oz soda per day,  16oz sweet tea per day, occasional coffee.   Social Determinants of Health   Financial Resource Strain:   . Difficulty of Paying Living Expenses:   Food Insecurity:   . Worried About Charity fundraiser in the Last Year:   . Academic librarian in the Last Year:   Transportation Needs:   . Film/video editor (Medical):   Marland Kitchen Lack of Transportation (Non-Medical):   Physical Activity:   . Days of Exercise per Week:   . Minutes of Exercise per Session:   Stress:   . Feeling of Stress :   Social Connections:   . Frequency of Communication with Friends and Family:   . Frequency of Social Gatherings with Friends and Family:   . Attends Religious Services:   . Active Member of Clubs or Organizations:   . Attends Archivist Meetings:   Marland Kitchen Marital Status:   Intimate Partner Violence:   . Fear of Current or Ex-Partner:   . Emotionally Abused:   Marland Kitchen Physically Abused:   . Sexually Abused:      PHYSICAL EXAM   Vitals:   04/28/20 1348  BP:  115/79  Pulse: 82  Weight: 246 lb 8 oz (111.8 kg)  Height: 5\' 11"  (1.803 m)    Not recorded    Blood pressure lying down 111/65, heart rate of 70; sitting 116/74, heart rate of 77; standing up 116/72, heart rate of 81  Body mass index is 34.38 kg/m.  PHYSICAL EXAMNIATION:  Gen: NAD, conversant, well nourised, well groomed                     Cardiovascular: Regular rate rhythm, no peripheral edema, warm, nontender. Eyes: Conjunctivae clear without exudates or hemorrhage Neck: Supple, no carotid bruits. Pulmonary: Clear to auscultation bilaterally   NEUROLOGICAL EXAM:  MENTAL STATUS: Speech:    Speech is normal; fluent and spontaneous with normal comprehension.  Cognition:     Orientation to time, place and person     Normal recent and remote memory     Normal Attention span and concentration     Normal Language, naming, repeating,spontaneous speech     Fund of knowledge   CRANIAL NERVES: CN II: Visual fields are full to confrontation. Pupils are round equal and briskly reactive to light. CN III, IV, VI: extraocular movement are normal. No ptosis. CN V: Facial sensation is intact to light touch CN VII: Face is symmetric with normal eye closure  CN VIII:  Hearing is normal to causal conversation. CN IX, X: Phonation is normal. CN XI: Head turning and shoulder shrug are intact  MOTOR: There is no pronator drift of out-stretched arms. Muscle bulk and tone are normal. Muscle strength is normal.  REFLEXES: Reflexes are 2+ and symmetric at the biceps, triceps, knees, and ankles. Plantar responses are flexor.  SENSORY: Intact to light touch, pinprick and vibratory sensation are intact in fingers and toes.  COORDINATION: There is no trunk or limb dysmetria noted.  GAIT/STANCE: Posture is normal. Gait is steady with normal steps, base, arm swing, and turning. Heel and toe walking are normal. Tandem gait is normal.  Romberg is absent.   DIAGNOSTIC DATA (LABS, IMAGING, TESTING) - I reviewed patient records, labs, notes, testing and imaging myself where available.   ASSESSMENT AND PLAN  Ayven Pheasant is a 49 y.o. male   Complex partial seizure with secondary generalization  Complete evaluation with MRI of the brain with without contrast  EEG  Starting lamotrigine titrating to 100 mg twice a day  No driving until seizure-free for 6 months   Marcial Pacas, M.D. Ph.D.  [facility] [address], [place], [place] [address] Ph: [phone] Fax: [phone]  CC: Trinna Post, PA-C

## 2020-04-29 ENCOUNTER — Telehealth: Payer: Self-pay | Admitting: Neurology

## 2020-04-29 NOTE — Telephone Encounter (Signed)
[employer] order sent to [facility]. They will obtain the auth and reach out to the patient to schedule.  

## 2020-04-30 NOTE — Telephone Encounter (Signed)
Novella Rob: [id number] (exp. 04/30/20 to 07/29/20) patient is scheduled at [facility] for 06/02/20.

## 2020-05-28 ENCOUNTER — Other Ambulatory Visit: Payer: Self-pay

## 2020-05-28 ENCOUNTER — Ambulatory Visit: Payer: Managed Care, Other (non HMO) | Admitting: Neurology

## 2020-05-28 DIAGNOSIS — G40209 Localization-related (focal) (partial) symptomatic epilepsy and epileptic syndromes with complex partial seizures, not intractable, without status epilepticus: Secondary | ICD-10-CM

## 2020-05-29 NOTE — Procedures (Signed)
   HISTORY: 49 year old male with history of chronic migraine, also reported a history of head injury, presented with passing out spells  TECHNIQUE:  This is a routine 16 channel EEG recording with one channel devoted to a limited EKG recording.  It was performed during wakefulness, drowsiness and asleep.  Hyperventilation and photic stimulation were performed as activating procedures.  There are minimum muscle and movement artifact noted.  Upon maximum arousal, posterior dominant waking rhythm consistent of mixed dysrhythmic alpha and beta range small amplitude activity. Activities are symmetric over the bilateral posterior derivations and attenuated with eye opening.  Hyperventilation produced mild/moderate buildup with higher amplitude and the slower activities noted.  Photic stimulation did not alter the tracing.  During EEG recording, patient developed drowsiness and no deeper stage of sleep was achieved  During EEG recording, there was no epileptiform discharge noted.  EKG demonstrate sinus rhythm, with heart rate of 80 bpm  CONCLUSION: This is a  normal EEG.  There is no electrodiagnostic evidence of epileptiform discharge.  The increase the beta range activity can be due to medication side effect.  Marcial Pacas, M.D. Ph.D.  [facility] [place], [place] [address] Phone: [phone] Fax:      [phone]

## 2020-06-02 ENCOUNTER — Other Ambulatory Visit: Payer: Self-pay

## 2020-06-02 ENCOUNTER — Ambulatory Visit
Admission: RE | Admit: 2020-06-02 | Discharge: 2020-06-02 | Disposition: A | Discharge status: Home / Self Care | Payer: Managed Care, Other (non HMO) | Source: Ambulatory Visit | Attending: Neurology | Admitting: Neurology

## 2020-06-02 DIAGNOSIS — G40209 Localization-related (focal) (partial) symptomatic epilepsy and epileptic syndromes with complex partial seizures, not intractable, without status epilepticus: Secondary | ICD-10-CM

## 2020-06-02 MED ORDER — GADOBENATE DIMEGLUMINE 529 MG/ML IV SOLN
20.0000 mL | Freq: Once | INTRAVENOUS | Status: AC | PRN
  Administered 2020-06-02: 20 mL via INTRAVENOUS

## 2020-06-03 ENCOUNTER — Telehealth: Payer: Self-pay | Admitting: Neurology

## 2020-06-03 NOTE — Telephone Encounter (Signed)
  °  IMPRESSION: This MRI of the brain with and without contrast shows the following: 1.   Multiple small round T2/FLAIR hyperintense foci in the subcortical and deep white matter of the hemispheres.  None of the foci appear to be acute and they do not enhance.  There are no new lesions compared to the 02/19/2019 MRI.  This is a nonspecific finding most likely represents chronic microvascular ischemic change, advanced for age.  The could also represent sequela of migraine headaches.  Demyelination is unlikely with this pattern. 2.   There is a normal enhancement pattern and no acute findings.  Please call patient, MRI of the brain showed no change compared to previous scan in March 2020, no contrast-enhancement, evidence of supratentorium  small vessel disease.

## 2020-06-03 NOTE — Telephone Encounter (Signed)
Attempted to contact the patient. It did not connect me to a VM. Will send a mychart message to patient.

## 2020-08-04 ENCOUNTER — Ambulatory Visit: Payer: Managed Care, Other (non HMO) | Admitting: Neurology

## 2020-08-04 ENCOUNTER — Encounter: Payer: Self-pay | Admitting: Neurology

## 2020-08-04 ENCOUNTER — Other Ambulatory Visit: Payer: Self-pay

## 2020-08-04 VITALS — BP 111/66 | HR 77 | Ht 71.0 in | Wt 249.2 lb

## 2020-08-04 DIAGNOSIS — G40209 Localization-related (focal) (partial) symptomatic epilepsy and epileptic syndromes with complex partial seizures, not intractable, without status epilepticus: Secondary | ICD-10-CM | POA: Diagnosis not present

## 2020-08-04 NOTE — Patient Instructions (Signed)
Continue Lamictal at current dose  Check blood work today  No driving until seizure free for 6 months Return back in 6 months, call if seizure

## 2020-08-04 NOTE — Progress Notes (Signed)
PATIENT: Todd Massey DOB: [birth year]  REASON FOR VISIT: follow up HISTORY FROM: patient  HISTORY OF PRESENT ILLNESS: Today 08/04/20  HISTORY  Todd Massey is a 49 year old male, is accompanied by his significant other Todd Massey, seen in request by his primary care PA Terrilee Croak, Adriana M for evaluation of chronic headaches, recurrent spells of transient dizziness, confusion, initial evaluation was on April 28, 2020.  I have reviewed and summarized the referring note from the referring physician.  He has past medical history of hyperlipidemia. Todd Massey has been with him for more than 5 years, as long as she could recall, patient has been complaining frequent headaches, dizziness transient confusion spells since she knows him.  The spells gradually getting worse, more frequent  He describes his spells as sudden onset overwhelming rushing throughout his body, he felt lightheaded, as if he is going to pass out, it most happened in a standing up position, but also happened in a sitting down position, it is often followed by bilateral frontal temporal region pressure headache last for a couple hours, he has been taking ibuprofen about 3 times each week for his recurrent spells and headaches over the past 1 year  On Apr 19, 2020, he got up in the morning, had breakfast, around 1030am, he bended over to throw away some weight to the trash can, he suddenly felt the family a rushing sensation throughout his body, he has to hold on the trash can, then lost consciousness, Todd Massey witnessed his arm curled up, body jerking movement last about 2 minutes, followed by postevent confusion, he had right lateral tongue biting, paramedic was called, by that time, he recovered back to baseline  His friend also reported witnessed episode of sudden onset stop talking, staring into space, lasting for couple minutes  I personally reviewed MRI of the brain without contrast in March 2020, scattered supratentorium small  vessel disease, no acute abnormality, MRI of the brain showed no significant abnormality  Laboratory evaluation June 2021, normal TSH, CBC, hemoglobin of 16.9, CMP showed elevated alkaline phosphate, AST 51, ALT 67,  Update August 04, 2020 SS: EEG was normal, no evidence of epileptiform discharge, increased beta range activity could be medication side effect.  MRI of the brain was overall stable, no contrast-enhancement, multiple small round foci, most likely represents chronic microvascular ischemic change, none were new since March 2020 MRI.   Remains on Lamictal 100 mg twice a day, tolerating well, when initiating medication titration, had episode of dizziness, blanking out, lasted less than 1 minute, then returned back to normal.  Since on full dose medication, no recurrent seizures.  He continues to work at [employer].  He is not driving.  Presents today for follow-up accompanied by longtime partner, Todd Massey.  Indicates his father, and niece have epilepsy.  REVIEW OF SYSTEMS: Out of a complete 14 system review of symptoms, the patient complains only of the following symptoms, and all other reviewed systems are negative.  Seizure  ALLERGIES: Allergies  Allergen Reactions  . Penicillins Rash and Other (See Comments)    Has patient had a PCN reaction causing immediate rash, facial/tongue/throat swelling, SOB or lightheadedness with hypotension: yes Has patient had a PCN reaction causing severe rash involving mucus membranes or skin necrosis: no Has patient had a PCN reaction that required hospitalization no Has patient had a PCN reaction occurring within the last 10 years: no If all of the above answers are "NO", then may proceed with Cephalosporin use.  HOME MEDICATIONS: Outpatient Medications Prior to Visit  Medication Sig Dispense Refill  . aspirin 81 MG chewable tablet Chew 1 tablet (81 mg total) by mouth daily. 120 tablet 0  . ibuprofen (ADVIL,MOTRIN) 200 MG tablet Take  400 mg by mouth every 6 (six) hours as needed (For headache or body aches.).    Marland Kitchen lamoTRIgine (LAMICTAL) 100 MG tablet Take 1 tablet (100 mg total) by mouth 2 (two) times daily. 180 tablet 4  . Magnesium 500 MG CAPS Take 1 capsule (500 mg total) by mouth daily. 120 capsule 0  . omeprazole (PRILOSEC) 40 MG capsule TAKE 1 CAPSULE BY MOUTH  DAILY 90 capsule 1  . rosuvastatin (CRESTOR) 40 MG tablet Take 1 tablet (40 mg total) by mouth daily. 90 tablet 3  . lamoTRIgine (LAMICTAL) 25 MG tablet 1 tab bid x one week 2 tab bid x 2nd week 3 tab bid x 3rd week 84 tablet 0   No facility-administered medications prior to visit.    PAST MEDICAL HISTORY: Past Medical History:  Diagnosis Date  . Allergy   . GERD (gastroesophageal reflux disease)   . Headache    migraines  . Migraine   . Seizure-like activity ([place])     PAST SURGICAL HISTORY: Past Surgical History:  Procedure Laterality Date  . APPENDECTOMY    . EUS N/A 03/31/2016   Procedure: ESOPHAGEAL ENDOSCOPIC ULTRASOUND (EUS) RADIAL;  Surgeon: Arta Silence, MD;  Location: [facility];  Service: Endoscopy;  Laterality: N/A;    FAMILY HISTORY: Family History  Problem Relation Age of Onset  . Diabetes Mother   . Other Father        unsure of medical history    SOCIAL HISTORY: Social History   Socioeconomic History  . Marital status: Single    Spouse name: Not on file  . Number of children: 2  . Years of education: 62  . Highest education level: High school graduate  Occupational History  . Occupation: produce associate    Comment: Public house manager  Tobacco Use  . Smoking status: Current Every Day Smoker    Packs/day: 0.50    Years: 30.00    Pack years: 15.00    Types: Cigarettes  . Smokeless tobacco: Never Used  Vaping Use  . Vaping Use: Never used  Substance and Sexual Activity  . Alcohol use: No  . Drug use: Never  . Sexual activity: Not on file  Other Topics Concern  . Not on file  Social History Narrative    Lives at home with significant other.   Right-handed.   One 16oz soda per day,  16oz sweet tea per day, occasional coffee.   Social Determinants of Health   Financial Resource Strain:   . Difficulty of Paying Living Expenses: Not on file  Food Insecurity:   . Worried About Charity fundraiser in the Last Year: Not on file  . Ran Out of Food in the Last Year: Not on file  Transportation Needs:   . Lack of Transportation (Medical): Not on file  . Lack of Transportation (Non-Medical): Not on file  Physical Activity:   . Days of Exercise per Week: Not on file  . Minutes of Exercise per Session: Not on file  Stress:   . Feeling of Stress : Not on file  Social Connections:   . Frequency of Communication with Friends and Family: Not on file  . Frequency of Social Gatherings with Friends and Family: Not on file  . Attends Religious  Services: Not on file  . Active Member of Clubs or Organizations: Not on file  . Attends Archivist Meetings: Not on file  . Marital Status: Not on file  Intimate Partner Violence:   . Fear of Current or Ex-Partner: Not on file  . Emotionally Abused: Not on file  . Physically Abused: Not on file  . Sexually Abused: Not on file   PHYSICAL EXAM  Vitals:   08/04/20 1412  BP: 111/66  Pulse: 77  Weight: 249 lb 3.2 oz (113 kg)  Height: 5\' 11"  (1.803 m)   Body mass index is 34.76 kg/m.  Generalized: Well developed, in no acute distress   Neurological examination  Mentation: Alert oriented to time, place, history taking. Follows all commands speech and language fluent Cranial nerve II-XII: Pupils were equal round reactive to light. Extraocular movements were full, visual field were full on confrontational test. Facial sensation and strength were normal. Head turning and shoulder shrug  were normal and symmetric. Motor: The motor testing reveals 5 over 5 strength of all 4 extremities. Good symmetric motor tone is noted throughout.  Sensory:  Sensory testing is intact to soft touch on all 4 extremities. No evidence of extinction is noted.  Coordination: Cerebellar testing reveals good finger-nose-finger and heel-to-shin bilaterally.  Gait and station: Gait is normal.  Reflexes: Deep tendon reflexes are symmetric and normal bilaterally.   DIAGNOSTIC DATA (LABS, IMAGING, TESTING) - I reviewed patient records, labs, notes, testing and imaging myself where available.  Lab Results  Component Value Date   WBC 9.8 04/22/2020   HGB 16.9 04/22/2020   HCT 51.3 (H) 04/22/2020   MCV 87 04/22/2020   PLT 219 04/22/2020      Component Value Date/Time   NA 139 04/22/2020 1116   NA 138 03/16/2015 1958   K 4.9 04/22/2020 1116   K 4.2 03/16/2015 1958   CL 103 04/22/2020 1116   CL 102 03/16/2015 1958   CO2 21 04/22/2020 1116   CO2 27 03/16/2015 1958   GLUCOSE 84 04/22/2020 1116   GLUCOSE 107 (H) 02/19/2019 1037   GLUCOSE 93 03/16/2015 1958   BUN 20 04/22/2020 1116   BUN 12 03/16/2015 1958   CREATININE 1.20 04/22/2020 1116   CREATININE 1.14 03/16/2015 1958   CALCIUM 9.8 04/22/2020 1116   CALCIUM 9.1 03/16/2015 1958   PROT 7.2 04/22/2020 1116   ALBUMIN 4.6 04/22/2020 1116   AST 51 (H) 04/22/2020 1116   ALT 67 (H) 04/22/2020 1116   ALKPHOS 269 (H) 04/22/2020 1116   BILITOT 0.5 04/22/2020 1116   GFRNONAA 71 04/22/2020 1116   GFRNONAA >60 03/16/2015 1958   GFRAA 82 04/22/2020 1116   GFRAA >60 03/16/2015 1958   Lab Results  Component Value Date   CHOL 215 (H) 08/31/2019   HDL 34 (L) 08/31/2019   LDLCALC 133 (H) 08/31/2019   TRIG 269 (H) 08/31/2019   CHOLHDL 6.3 (H) 08/31/2019   Lab Results  Component Value Date   HGBA1C 5.9 (H) 08/31/2019   No results found for: VITAMINB12 Lab Results  Component Value Date   TSH 1.680 04/22/2020    ASSESSMENT AND PLAN 49 y.o. year old male  has a past medical history of Allergy, GERD (gastroesophageal reflux disease), Headache, Migraine, and Seizure-like activity ([place]). here  with:  1.  Complex partial seizure with secondary generalization  -MRI of the brain was overall stable, no contrast-enhancement, multiple small round foci, most likely represents chronic microvascular ischemic change, none were new  since March 2020 MRI  -EEG was normal, no epileptiform discharge, increased beta range activity could be medication side effect  -Is doing well with Lamictal, continue 100 mg twice a day  -Will check routine blood work, has history of elevated liver enzymes  -Follow-up in 6 months or sooner if needed, call for seizure activity, no driving until seizure-free for 6 months  I spent 30 minutes of face-to-face and non-face-to-face time with patient.  This included previsit chart review, lab review, study review, order entry, electronic health record documentation, patient education.  Butler Denmark, AGNP-C, DNP 08/04/2020, 2:40 PM [facility] [address], [place],  [address] [phone]

## 2020-08-05 LAB — CBC WITH DIFFERENTIAL/PLATELET
Basophils Absolute: 0.1 10*3/uL (ref 0.0–0.2)
Basos: 1 %
EOS (ABSOLUTE): 0.3 10*3/uL (ref 0.0–0.4)
Eos: 4 %
Hematocrit: 45 % (ref 37.5–51.0)
Hemoglobin: 15.2 g/dL (ref 13.0–17.7)
Immature Grans (Abs): 0 10*3/uL (ref 0.0–0.1)
Immature Granulocytes: 0 %
Lymphocytes Absolute: 3.1 10*3/uL (ref 0.7–3.1)
Lymphs: 38 %
MCH: 29.5 pg (ref 26.6–33.0)
MCHC: 33.8 g/dL (ref 31.5–35.7)
MCV: 87 fL (ref 79–97)
Monocytes Absolute: 0.9 10*3/uL (ref 0.1–0.9)
Monocytes: 10 %
Neutrophils Absolute: 3.8 10*3/uL (ref 1.4–7.0)
Neutrophils: 47 %
Platelets: 185 10*3/uL (ref 150–450)
RBC: 5.15 x10E6/uL (ref 4.14–5.80)
RDW: 13.5 % (ref 11.6–15.4)
WBC: 8.2 10*3/uL (ref 3.4–10.8)

## 2020-08-05 LAB — COMPREHENSIVE METABOLIC PANEL
ALT: 61 IU/L — ABNORMAL HIGH (ref 0–44)
AST: 36 IU/L (ref 0–40)
Albumin/Globulin Ratio: 1.6 (ref 1.2–2.2)
Albumin: 4.2 g/dL (ref 4.0–5.0)
Alkaline Phosphatase: 276 IU/L — ABNORMAL HIGH (ref 44–121)
BUN/Creatinine Ratio: 16 (ref 9–20)
BUN: 18 mg/dL (ref 6–24)
Bilirubin Total: 0.3 mg/dL (ref 0.0–1.2)
CO2: 19 mmol/L — ABNORMAL LOW (ref 20–29)
Calcium: 8.9 mg/dL (ref 8.7–10.2)
Chloride: 104 mmol/L (ref 96–106)
Creatinine, Ser: 1.11 mg/dL (ref 0.76–1.27)
GFR calc Af Amer: 90 mL/min/{1.73_m2} (ref >59)
GFR calc non Af Amer: 78 mL/min/{1.73_m2} (ref >59)
Globulin, Total: 2.6 g/dL (ref 1.5–4.5)
Glucose: 83 mg/dL (ref 65–99)
Potassium: 4.3 mmol/L (ref 3.5–5.2)
Sodium: 137 mmol/L (ref 134–144)
Total Protein: 6.8 g/dL (ref 6.0–8.5)

## 2020-08-05 LAB — LAMOTRIGINE LEVEL: Lamotrigine Lvl: 3.4 ug/mL (ref 2.0–20.0)

## 2020-08-13 ENCOUNTER — Other Ambulatory Visit: Payer: Managed Care, Other (non HMO)

## 2020-08-13 DIAGNOSIS — Z20822 Contact with and (suspected) exposure to covid-19: Secondary | ICD-10-CM

## 2020-08-15 LAB — NOVEL CORONAVIRUS, NAA: SARS-CoV-2, NAA: NOT DETECTED

## 2020-08-15 LAB — SARS-COV-2, NAA 2 DAY TAT

## 2020-08-25 ENCOUNTER — Ambulatory Visit: Payer: Managed Care, Other (non HMO) | Attending: Internal Medicine

## 2020-08-25 DIAGNOSIS — Z23 Encounter for immunization: Secondary | ICD-10-CM

## 2020-08-25 NOTE — Progress Notes (Signed)
   Covid-19 Vaccination Clinic  Name:  Cordero Surette    MRN: [id number] DOB: 1970/11/27  08/25/2020  Mr. Masterson was observed post Covid-19 immunization for 15 minutes without incident. He was provided with Vaccine Information Sheet and instruction to access the V-Safe system.   Mr. Grater was instructed to call 911 with any severe reactions post vaccine: Marland Kitchen Difficulty breathing  . Swelling of face and throat  . A fast heartbeat  . A bad rash all over body  . Dizziness and weakness   Immunizations Administered    Name Date Dose VIS Date Route   Pfizer COVID-19 Vaccine 08/25/2020  3:58 PM 0.3 mL 01/16/2019 Intramuscular   Manufacturer: [place]   Lot: [id number]   [place]: [id number]

## 2020-09-08 ENCOUNTER — Other Ambulatory Visit: Payer: Self-pay | Admitting: *Deleted

## 2020-09-08 ENCOUNTER — Encounter: Payer: Self-pay | Admitting: Neurology

## 2020-09-08 MED ORDER — LAMOTRIGINE 100 MG PO TABS: 100.0000 mg | ORAL_TABLET | Freq: Two times a day (BID) | ORAL | 1 refills | Status: DC

## 2020-09-15 ENCOUNTER — Ambulatory Visit: Payer: Managed Care, Other (non HMO) | Attending: Internal Medicine

## 2020-09-15 DIAGNOSIS — Z23 Encounter for immunization: Secondary | ICD-10-CM

## 2020-09-15 NOTE — Progress Notes (Signed)
   Covid-19 Vaccination Clinic  Name:  Dannell Gortney    MRN: [id number] DOB: 1971/02/18  09/15/2020  Mr. Shiffler was observed post Covid-19 immunization for 15 minutes without incident. He was provided with Vaccine Information Sheet and instruction to access the V-Safe system.   Mr. Cline was instructed to call 911 with any severe reactions post vaccine: Marland Kitchen Difficulty breathing  . Swelling of face and throat  . A fast heartbeat  . A bad rash all over body  . Dizziness and weakness   Immunizations Administered    Name Date Dose VIS Date Route   Pfizer COVID-19 Vaccine 09/15/2020  3:50 PM 0.3 mL 01/16/2019 Intramuscular   Manufacturer: [employer]   Lot: [id number]   [place]: [address]-6

## 2020-09-29 ENCOUNTER — Other Ambulatory Visit: Payer: Self-pay | Admitting: Physician Assistant

## 2020-09-29 DIAGNOSIS — K219 Gastro-esophageal reflux disease without esophagitis: Secondary | ICD-10-CM

## 2020-09-29 NOTE — Telephone Encounter (Signed)
Requested Prescriptions  Pending Prescriptions Disp Refills  . omeprazole (PRILOSEC) 40 MG capsule [Pharmacy Med Name: Omeprazole 40 MG Oral Capsule Delayed Release] 90 capsule 2    Sig: TAKE 1 CAPSULE BY MOUTH  DAILY     Gastroenterology: Proton Pump Inhibitors Passed - 09/29/2020 10:14 PM      Passed - Valid encounter within last 12 months    Recent Outpatient Visits          5 months ago [place] [facility], Fabio Bering M, PA-C   1 year ago Hyperlipidemia, unspecified hyperlipidemia type   [facility], Wendee Beavers, [place]   1 year ago [facility] discharge follow-up   [place], [place], [place]   1 year ago Right low back pain, unspecified chronicity, unspecified whether sciatica present   [place], PA-C   1 year ago Hyperlipidemia, unspecified hyperlipidemia type   [place], [place], [place]

## 2020-11-02 ENCOUNTER — Other Ambulatory Visit: Payer: Self-pay | Admitting: Physician Assistant

## 2020-11-18 ENCOUNTER — Other Ambulatory Visit: Payer: Self-pay | Admitting: Physician Assistant

## 2021-01-11 ENCOUNTER — Encounter: Payer: Self-pay | Admitting: Emergency Medicine

## 2021-01-11 ENCOUNTER — Emergency Department: Payer: Self-pay

## 2021-01-11 ENCOUNTER — Other Ambulatory Visit: Payer: Self-pay

## 2021-01-11 ENCOUNTER — Emergency Department
Admission: EM | Admit: 2021-01-11 | Discharge: 2021-01-12 | Disposition: A | Discharge status: Home / Self Care | Payer: Self-pay | Attending: Emergency Medicine | Admitting: Emergency Medicine

## 2021-01-11 DIAGNOSIS — R079 Chest pain, unspecified: Secondary | ICD-10-CM | POA: Insufficient documentation

## 2021-01-11 DIAGNOSIS — Z7982 Long term (current) use of aspirin: Secondary | ICD-10-CM | POA: Insufficient documentation

## 2021-01-11 DIAGNOSIS — F17210 Nicotine dependence, cigarettes, uncomplicated: Secondary | ICD-10-CM | POA: Insufficient documentation

## 2021-01-11 LAB — COMPREHENSIVE METABOLIC PANEL
ALT: 83 U/L — ABNORMAL HIGH (ref 0–44)
AST: 58 U/L — ABNORMAL HIGH (ref 15–41)
Albumin: 4.3 g/dL (ref 3.5–5.0)
Alkaline Phosphatase: 241 U/L — ABNORMAL HIGH (ref 38–126)
Anion gap: 10 (ref 5–15)
BUN: 17 mg/dL (ref 6–20)
CO2: 21 mmol/L — ABNORMAL LOW (ref 22–32)
Calcium: 8.9 mg/dL (ref 8.9–10.3)
Chloride: 107 mmol/L (ref 98–111)
Creatinine, Ser: 1.38 mg/dL — ABNORMAL HIGH (ref 0.61–1.24)
GFR, Estimated: >60 mL/min (ref >60)
Glucose, Bld: 134 mg/dL — ABNORMAL HIGH (ref 70–99)
Potassium: 4 mmol/L (ref 3.5–5.1)
Sodium: 138 mmol/L (ref 135–145)
Total Bilirubin: 0.8 mg/dL (ref 0.3–1.2)
Total Protein: 7.7 g/dL (ref 6.5–8.1)

## 2021-01-11 LAB — TROPONIN I (HIGH SENSITIVITY): Troponin I (High Sensitivity): 6 ng/L (ref <18)

## 2021-01-11 LAB — CBC
HCT: 48.7 % (ref 39.0–52.0)
Hemoglobin: 16.2 g/dL (ref 13.0–17.0)
MCH: 29.1 pg (ref 26.0–34.0)
MCHC: 33.3 g/dL (ref 30.0–36.0)
MCV: 87.6 fL (ref 80.0–100.0)
Platelets: 182 10*3/uL (ref 150–400)
RBC: 5.56 MIL/uL (ref 4.22–5.81)
RDW: 13.6 % (ref 11.5–15.5)
WBC: 10.4 10*3/uL (ref 4.0–10.5)
nRBC: 0 % (ref 0.0–0.2)

## 2021-01-11 MED ORDER — KETOROLAC TROMETHAMINE 30 MG/ML IJ SOLN
30.0000 mg | Freq: Once | INTRAMUSCULAR | Status: AC
  Administered 2021-01-11: 30 mg via INTRAMUSCULAR
  Filled 2021-01-11: qty 1

## 2021-01-11 NOTE — ED Triage Notes (Signed)
Patient from home via POV with complaints of center CP with radiation to back, denies SOB or dizziness or N/V, pt dull pressure 7/10 that started 2 days prior   History of daily ASA and took IBU for pain this am with no effects

## 2021-01-11 NOTE — ED Provider Notes (Signed)
[facility] Emergency Department Provider Note   ____________________________________________   Event Date/Time   First MD Initiated Contact with Patient 01/11/21 2147     (approximate)  I have reviewed the triage vital signs and the nursing notes.   HISTORY  Chief Complaint Chest Pain    HPI Todd Massey is a 50 y.o. male with past medical history of migraines, GERD, and seasonal allergies who presents to the ED for chest pain.  Patient reports that he has had 2 days of pressure in the center of his chest that seem to wax and wane in severity.  Pain radiates around to the left side of his chest, occasionally reaching his back.  He has not had any associated fevers, cough, shortness of breath, nausea, vomiting, or difficulty eating.  Pain does seem to be worse when he goes to sit in an upright position.  He has noticed some soreness to touch over his left chest, but denies any recent trauma or heavy lifting.  He denies any history of similar symptoms.        Past Medical History:  Diagnosis Date  . Allergy   . GERD (gastroesophageal reflux disease)   . Headache    migraines  . Migraine   . Seizure-like activity [facility])     Patient Active Problem List   Diagnosis Date Noted  . Partial symptomatic epilepsy with complex partial seizures, not intractable, without status epilepticus ([place]) 04/28/2020  . Hyperlipidemia 03/01/2019  . Migraine 03/01/2019  . Prediabetes 03/01/2019  . Left arm numbness 02/19/2019    Past Surgical History:  Procedure Laterality Date  . APPENDECTOMY    . EUS N/A 03/31/2016   Procedure: ESOPHAGEAL ENDOSCOPIC ULTRASOUND (EUS) RADIAL;  Surgeon: Arta Silence, MD;  Location: [facility];  Service: Endoscopy;  Laterality: N/A;    Prior to Admission medications   Medication Sig Start Date End Date Taking? Authorizing Provider  aspirin 81 MG chewable tablet Chew 1 tablet (81 mg total) by mouth daily. 02/21/19   Bettey Costa, MD   ibuprofen (ADVIL,MOTRIN) 200 MG tablet Take 400 mg by mouth every 6 (six) hours as needed (For headache or body aches.).    [provider]  lamoTRIgine (LAMICTAL) 100 MG tablet Take 1 tablet (100 mg total) by mouth 2 (two) times daily. 09/08/20   Suzzanne Cloud, NP  Magnesium 500 MG CAPS Take 1 capsule (500 mg total) by mouth daily. 02/20/19   Bettey Costa, MD  omeprazole (PRILOSEC) 40 MG capsule TAKE 1 CAPSULE BY MOUTH  DAILY 09/29/20   Trinna Post, PA-C  rosuvastatin (CRESTOR) 20 MG tablet TAKE 1 TABLET BY MOUTH  DAILY 11/19/20   Trinna Post, PA-C  rosuvastatin (CRESTOR) 40 MG tablet Take 1 tablet (40 mg total) by mouth daily. 04/22/20   Trinna Post, PA-C    Allergies Penicillins  Family History  Problem Relation Age of Onset  . Diabetes Mother   . Other Father        unsure of medical history    Social History Social History   Tobacco Use  . Smoking status: Current Every Day Smoker    Packs/day: 0.50    Years: 30.00    Pack years: 15.00    Types: Cigarettes  . Smokeless tobacco: Never Used  Vaping Use  . Vaping Use: Never used  Substance Use Topics  . Alcohol use: No  . Drug use: Never    Review of Systems  Constitutional: No fever/chills Eyes:  No visual changes. ENT: No sore throat. Cardiovascular: Positive for chest pain. Respiratory: Denies shortness of breath. Gastrointestinal: No abdominal pain.  No nausea, no vomiting.  No diarrhea.  No constipation. Genitourinary: Negative for dysuria. Musculoskeletal: Negative for back pain. Skin: Negative for rash. Neurological: Negative for headaches, focal weakness or numbness.  ____________________________________________   PHYSICAL EXAM:  VITAL SIGNS: ED Triage Vitals  Enc Vitals Group     BP 01/11/21 2050 140/83     Pulse Rate 01/11/21 2050 77     Resp 01/11/21 2050 18     Temp 01/11/21 2050 97.8 F (36.6 C)     Temp Source 01/11/21 2050 Oral     SpO2 01/11/21 2050 97 %      Weight 01/11/21 2051 240 lb (108.9 kg)     Height 01/11/21 2051 6\' 1"  (1.854 m)     Head Circumference --      Peak Flow --      Pain Score 01/11/21 2050 7     Pain Loc --      Pain Edu? --      Excl. in [place]? --     Constitutional: Alert and oriented. Eyes: Conjunctivae are normal. Head: Atraumatic. Nose: No congestion/rhinnorhea. Mouth/Throat: Mucous membranes are moist. Neck: Normal ROM Cardiovascular: Normal rate, regular rhythm. Grossly normal heart sounds.  2+ radial pulses bilaterally. Respiratory: Normal respiratory effort.  No retractions. Lungs CTAB.  Left chest wall tender to palpation, reproducing patient's pain. Gastrointestinal: Soft and nontender. No distention. Genitourinary: deferred Musculoskeletal: No lower extremity tenderness nor edema. Neurologic:  Normal speech and language. No gross focal neurologic deficits are appreciated. Skin:  Skin is warm, dry and intact. No rash noted. Psychiatric: Mood and affect are normal. Speech and behavior are normal.  ____________________________________________   LABS (all labs ordered are listed, but only abnormal results are displayed)  Labs Reviewed  COMPREHENSIVE METABOLIC PANEL - Abnormal; Notable for the following components:      Result Value   CO2 21 (*)    Glucose, Bld 134 (*)    Creatinine, Ser 1.38 (*)    AST 58 (*)    ALT 83 (*)    Alkaline Phosphatase 241 (*)    All other components within normal limits  CBC  TROPONIN I (HIGH SENSITIVITY)  TROPONIN I (HIGH SENSITIVITY)   ____________________________________________  EKG  ED ECG REPORT I, Blake Divine, the attending physician, personally viewed and interpreted this ECG.   Date: 01/11/2021  EKG Time: 20:34  Rate: 72  Rhythm: normal sinus rhythm  Axis: Normal  Intervals:none  ST&T Change: None   PROCEDURES  Procedure(s) performed (including Critical Care):  Procedures   ____________________________________________   INITIAL  IMPRESSION / ASSESSMENT AND PLAN / ED COURSE       50 year old male with past medical history of GERD, migraines, and seasonal allergies who presents to the ED with waxing and waning chest pain for the past 2 days that is a pressure radiating around to the left side of his chest and occasionally his back.  Pain is reproducible with palpation of his anterior chest wall, also seems to be worse when he raises up to a sitting position.  This seems most likely to be musculoskeletal in etiology and I have a low suspicion for PE or dissection.  EKG shows no evidence of arrhythmia or ischemia and initial troponin is negative.  Chest x-ray reviewed by me and shows no infiltrate, edema, or effusion.  We will recheck second troponin, but  if this is negative patient will be appropriate for discharge home with PCP follow-up.  We will treat his pain with IM Toradol.      ____________________________________________   FINAL CLINICAL IMPRESSION(S) / ED DIAGNOSES  Final diagnoses:  Nonspecific chest pain     ED Discharge Orders    None       Note:  This document was prepared using Dragon voice recognition software and may include unintentional dictation errors.   Blake Divine, MD 01/12/21 [phone]

## 2021-01-12 LAB — TROPONIN I (HIGH SENSITIVITY): Troponin I (High Sensitivity): 5 ng/L (ref <18)

## 2021-01-12 NOTE — ED Notes (Signed)
Patient up to bathroom at this time.

## 2021-01-12 NOTE — ED Notes (Signed)
Patient placed back on monitor, patient states chest pain has subsided. Pt states "I think it is muscular pain from lifting things at work".

## 2021-01-12 NOTE — ED Provider Notes (Signed)
Delta trop normal, patient feels well, appr for d/c, with outpatient f/u    Lavonia Drafts, MD 01/12/21 [phone]

## 2021-02-03 ENCOUNTER — Ambulatory Visit: Payer: Self-pay | Admitting: Neurology

## 2021-06-10 ENCOUNTER — Other Ambulatory Visit: Payer: Self-pay | Admitting: Physician Assistant

## 2021-06-10 MED ORDER — ROSUVASTATIN CALCIUM 20 MG PO TABS: 20.0000 mg | ORAL_TABLET | Freq: Every day | ORAL | 0 refills | Status: DC

## 2021-06-10 NOTE — Telephone Encounter (Signed)
Requested medication (s) are due for refill today: no  Requested medication (s) are on the active medication list:  yes  Last refill:  11/18/2020  Future visit scheduled: no  Notes to clinic:  overdue for follow up appointment  Message sent for patient to contact office    Requested Prescriptions  Pending Prescriptions Disp Refills   rosuvastatin (CRESTOR) 20 MG tablet 90 tablet 0    Sig: Take 1 tablet (20 mg total) by mouth daily.      Cardiovascular:  Antilipid - Statins Failed - 06/10/2021  9:57 AM      Failed - Total Cholesterol in normal range and within 360 days    Cholesterol, Total  Date Value Ref Range Status  08/31/2019 215 (H) 100 - 199 mg/dL Final          Failed - LDL in normal range and within 360 days    LDL Chol Calc (NIH)  Date Value Ref Range Status  08/31/2019 133 (H) 0 - 99 mg/dL Final          Failed - HDL in normal range and within 360 days    HDL  Date Value Ref Range Status  08/31/2019 34 (L) >39 mg/dL Final          Failed - Triglycerides in normal range and within 360 days    Triglycerides  Date Value Ref Range Status  08/31/2019 269 (H) 0 - 149 mg/dL Final          Failed - Valid encounter within last 12 months    Recent Outpatient Visits           1 year ago [place], [place], PA-C   1 year ago Hyperlipidemia, unspecified hyperlipidemia type   [facility], Wendee Beavers, [place]   2 years ago [facility] discharge follow-up   [place], [place], [place]   2 years ago Right low back pain, unspecified chronicity, unspecified whether sciatica present   [place], [place], [place]   2 years ago Hyperlipidemia, unspecified hyperlipidemia type   [place], [place], [place]                Passed - Patient is not pregnant

## 2021-06-10 NOTE — Telephone Encounter (Signed)
Medication Refill - Medication: rosuvastatin (CRESTOR) 20 MG tablet  Has the patient contacted their pharmacy? No.  Preferred Pharmacy (with phone number or street name):  [place], [place]  Phone:  [phone] Fax:  [phone]  Agent: Please be advised that RX refills may take up to 3 business days. We ask that you follow-up with your pharmacy.

## 2021-09-01 ENCOUNTER — Telehealth: Payer: Self-pay | Admitting: Neurology

## 2021-09-01 MED ORDER — LAMOTRIGINE 100 MG PO TABS: 100.0000 mg | ORAL_TABLET | Freq: Two times a day (BID) | ORAL | 0 refills | Status: DC

## 2021-09-01 NOTE — Telephone Encounter (Signed)
Refill for lamictal sent to [facility]. Patient has an upcoming appt in January 2023 with Dr. Krista Blue.

## 2021-09-01 NOTE — Telephone Encounter (Signed)
Pt called requesting refill for lamoTRIgine (LAMICTAL) 100 MG tablet. [place], [place].

## 2021-10-07 ENCOUNTER — Other Ambulatory Visit: Payer: Self-pay

## 2021-10-07 DIAGNOSIS — K219 Gastro-esophageal reflux disease without esophagitis: Secondary | ICD-10-CM

## 2021-10-07 NOTE — Telephone Encounter (Signed)
Copied from [place] [id number]. Topic: Quick Communication - Rx Refill/Question >> Oct 07, 2021  4:44 PM Tessa Lerner A wrote: Medication: omeprazole (PRILOSEC) 40 MG capsule [[id number]]  Has the patient contacted their pharmacy? No. The patient was directed to do so for future requests  (Agent: If no, request that the patient contact the pharmacy for the refill. If patient does not wish to contact the pharmacy document the reason why and proceed with request.) (Agent: If yes, when and what did the pharmacy advise?)  Preferred Pharmacy (with phone number or street name): [place], [place]  Phone:  [phone] Fax:  [phone]  Has the patient been seen for an appointment in the last year OR does the patient have an upcoming appointment? No.  Agent: Please be advised that RX refills may take up to 3 business days. We ask that you follow-up with your pharmacy.

## 2021-10-07 NOTE — Telephone Encounter (Signed)
Requested medications are due for refill today.  yes  Requested medications are on the active medications list.  yes  Last refill. 09/29/2020  Future visit scheduled.   yes  Notes to clinic.  Pt is more than 3 months overdue for office visit.

## 2021-10-08 MED ORDER — OMEPRAZOLE 40 MG PO CPDR: 40.0000 mg | DELAYED_RELEASE_CAPSULE | Freq: Every day | ORAL | 0 refills | Status: DC

## 2021-11-24 ENCOUNTER — Encounter: Payer: Self-pay | Admitting: Family Medicine

## 2021-11-24 ENCOUNTER — Other Ambulatory Visit: Payer: Self-pay

## 2021-11-24 ENCOUNTER — Ambulatory Visit (INDEPENDENT_AMBULATORY_CARE_PROVIDER_SITE_OTHER): Payer: BC Managed Care – PPO | Admitting: Family Medicine

## 2021-11-24 VITALS — BP 127/94 | HR 81 | Resp 16 | Wt 233.5 lb

## 2021-11-24 DIAGNOSIS — G43809 Other migraine, not intractable, without status migrainosus: Secondary | ICD-10-CM

## 2021-11-24 DIAGNOSIS — Z720 Tobacco use: Secondary | ICD-10-CM

## 2021-11-24 DIAGNOSIS — R569 Unspecified convulsions: Secondary | ICD-10-CM | POA: Diagnosis not present

## 2021-11-24 DIAGNOSIS — E785 Hyperlipidemia, unspecified: Secondary | ICD-10-CM | POA: Diagnosis not present

## 2021-11-24 DIAGNOSIS — K219 Gastro-esophageal reflux disease without esophagitis: Secondary | ICD-10-CM | POA: Insufficient documentation

## 2021-11-24 DIAGNOSIS — K2100 Gastro-esophageal reflux disease with esophagitis, without bleeding: Secondary | ICD-10-CM | POA: Diagnosis not present

## 2021-11-24 DIAGNOSIS — G40209 Localization-related (focal) (partial) symptomatic epilepsy and epileptic syndromes with complex partial seizures, not intractable, without status epilepticus: Secondary | ICD-10-CM

## 2021-11-24 MED ORDER — ASPIRIN 81 MG PO CHEW: 81.0000 mg | CHEWABLE_TABLET | Freq: Every day | ORAL | 3 refills | Status: DC

## 2021-11-24 MED ORDER — MAGNESIUM 500 MG PO CAPS: 500.0000 mg | ORAL_CAPSULE | Freq: Every day | ORAL | 3 refills | Status: AC

## 2021-11-24 MED ORDER — LAMOTRIGINE 100 MG PO TABS: 100.0000 mg | ORAL_TABLET | Freq: Two times a day (BID) | ORAL | 3 refills | Status: DC

## 2021-11-24 MED ORDER — OMEPRAZOLE 40 MG PO CPDR: 40.0000 mg | DELAYED_RELEASE_CAPSULE | Freq: Every day | ORAL | 3 refills | Status: DC

## 2021-11-24 MED ORDER — ROSUVASTATIN CALCIUM 40 MG PO TABS: 40.0000 mg | ORAL_TABLET | Freq: Every day | ORAL | 3 refills | Status: DC

## 2021-11-24 NOTE — Assessment & Plan Note (Signed)
Has been off statin d/t lack of insurance; request for lab work once receives insurance cards Refill provided

## 2021-11-24 NOTE — Assessment & Plan Note (Signed)
Stroke like symptoms -continue mg and asa as primary treatment -denies reoccurrence

## 2021-11-24 NOTE — Patient Instructions (Signed)
The 10-year ASCVD risk score (Arnett DK, et al., 2019) is: 12.3%   Values used to calculate the score:     Age: 51 years     Sex: Male     Is Non-Hispanic African American: No     Diabetic: No     Tobacco smoker: Yes     Systolic Blood Pressure: 081 mmHg     Is BP treated: No     HDL Cholesterol: 34 mg/dL     Total Cholesterol: 215 mg/dL   Recommend increase in diet of healthier fat choices- low fat meats, oils that are not solid at room temperature, nuts, seeds, fish- cod, halibut, salmon, and avocado. Exercise can also increase this number.  Supplemental omega 3's can be taken as well but are not as helpful as dietary/exercise changes.  Goal of 150 mins/week- break up to 15- 10 mins chunks of purposeful exercise.

## 2021-11-24 NOTE — Assessment & Plan Note (Signed)
Stable with medication, PPI Refill provided

## 2021-11-24 NOTE — Assessment & Plan Note (Signed)
Previously quit before Has reduces his intake Referral placed to smoking cessation

## 2021-11-24 NOTE — Assessment & Plan Note (Signed)
Continue Lamictal reports 1x/dosing d/t GI SEs- has discussed with his neurologist Encouraged to revisit BID dosing with small meal/snack

## 2021-11-24 NOTE — Progress Notes (Signed)
Established patient visit   Patient: Todd Massey   DOB: 10-21-1971   51 y.o. Male  MRN: [id number] Visit Date: 11/24/2021  Today's healthcare provider: Gwyneth Sprout, FNP   Chief Complaint  Patient presents with   Hyperlipidemia   Subjective    HPI  Lipid/Cholesterol, Follow-up  Last lipid panel Other pertinent labs  Lab Results  Component Value Date   CHOL 215 (H) 08/31/2019   HDL 34 (L) 08/31/2019   LDLCALC 133 (H) 08/31/2019   TRIG 269 (H) 08/31/2019   CHOLHDL 6.3 (H) 08/31/2019   Lab Results  Component Value Date   ALT 83 (H) 01/11/2021   AST 58 (H) 01/11/2021   PLT 182 01/11/2021   TSH 1.680 04/22/2020     He was last seen for this 6 months ago.  Management since that visit includes increase Crestor to 40mg . Has been out of medication d/t lack of insurance.  He reports good compliance with treatment. He is not having side effects.   Symptoms: No chest pain No chest pressure/discomfort  No dyspnea No lower extremity edema  Yes numbness or tingling of extremity No orthopnea  No palpitations No paroxysmal nocturnal dyspnea  No speech difficulty No syncope   Current diet: well balanced Current exercise: no regular exercise  The 10-year ASCVD risk score (Arnett DK, et al., 2019) is: 12.3%; discussed increase in score based on current data to 12%.  ---------------------------------------------------------------------------------------------------   Medications: Outpatient Medications Prior to Visit  Medication Sig   ibuprofen (ADVIL,MOTRIN) 200 MG tablet Take 400 mg by mouth every 6 (six) hours as needed (For headache or body aches.).   [DISCONTINUED] aspirin 81 MG chewable tablet Chew 1 tablet (81 mg total) by mouth daily.   [DISCONTINUED] lamoTRIgine (LAMICTAL) 100 MG tablet Take 1 tablet (100 mg total) by mouth 2 (two) times daily.   [DISCONTINUED] Magnesium 500 MG CAPS Take 1 capsule (500 mg total) by mouth daily.   [DISCONTINUED] omeprazole  (PRILOSEC) 40 MG capsule Take 1 capsule (40 mg total) by mouth daily.   [DISCONTINUED] rosuvastatin (CRESTOR) 40 MG tablet Take 1 tablet (40 mg total) by mouth daily.   [DISCONTINUED] rosuvastatin (CRESTOR) 20 MG tablet Take 1 tablet (20 mg total) by mouth daily. Please schedule an office visit before anymore refills.   No facility-administered medications prior to visit.    Review of Systems     Objective    BP (!) 127/94    Pulse 81    Resp 16    Wt 233 lb 8 oz (105.9 kg)    SpO2 99%    BMI 30.81 kg/m    Physical Exam Vitals and nursing note reviewed.  Constitutional:      Appearance: Normal appearance. He is obese.  HENT:     Head: Normocephalic and atraumatic.  Eyes:     Pupils: Pupils are equal, round, and reactive to light.  Cardiovascular:     Rate and Rhythm: Normal rate and regular rhythm.     Pulses: Normal pulses.     Heart sounds: Normal heart sounds.  Pulmonary:     Effort: Pulmonary effort is normal.     Breath sounds: Normal breath sounds.  Musculoskeletal:        General: Normal range of motion.     Cervical back: Normal range of motion.  Skin:    General: Skin is warm and dry.     Capillary Refill: Capillary refill takes less than 2 seconds.  Neurological:  General: No focal deficit present.     Mental Status: He is alert and oriented to person, place, and time. Mental status is at baseline.  Psychiatric:        Mood and Affect: Mood normal.        Behavior: Behavior normal.        Thought Content: Thought content normal.        Judgment: Judgment normal.     No results found for any visits on 11/24/21.  Assessment & Plan     Problem List Items Addressed This Visit       Cardiovascular and Mediastinum   Migraine - Primary    Stroke like symptoms -continue mg and asa as primary treatment -denies reoccurrence       Relevant Medications   Magnesium 500 MG CAPS   rosuvastatin (CRESTOR) 40 MG tablet   lamoTRIgine (LAMICTAL) 100 MG tablet    aspirin 81 MG chewable tablet     Digestive   Gastroesophageal reflux disease with esophagitis without hemorrhage    Stable with medication, PPI Refill provided      Relevant Medications   omeprazole (PRILOSEC) 40 MG capsule     Nervous and Auditory   Partial symptomatic epilepsy with complex partial seizures, not intractable, without status epilepticus (HCC)    Continue Lamictal reports 1x/dosing d/t GI SEs- has discussed with his neurologist Encouraged to revisit BID dosing with small meal/snack      Relevant Medications   lamoTRIgine (LAMICTAL) 100 MG tablet     Other   Hyperlipidemia    Has been off statin d/t lack of insurance; request for lab work once receives insurance cards Refill provided      Relevant Medications   rosuvastatin (CRESTOR) 40 MG tablet   aspirin 81 MG chewable tablet   Seizure-like activity ([place])    No recent seizures Continue to take medications as prescribed and address difficulties in treatment      Relevant Medications   lamoTRIgine (LAMICTAL) 100 MG tablet   Tobacco abuse    Previously quit before Has reduces his intake Referral placed to smoking cessation      Relevant Orders   Ambulatory referral to Smoking Cessation Program     Return in about 3 months (around 02/22/2022) for chonic disease management.      Vonna Kotyk, FNP, have reviewed all documentation for this visit. The documentation on 11/24/21 for the exam, diagnosis, procedures, and orders are all accurate and complete.    Gwyneth Sprout, [place] [phone] (phone) [phone] (fax)  [place]

## 2021-11-24 NOTE — Assessment & Plan Note (Signed)
No recent seizures Continue to take medications as prescribed and address difficulties in treatment

## 2021-11-25 ENCOUNTER — Telehealth: Payer: Self-pay | Admitting: Family Medicine

## 2021-11-25 NOTE — Telephone Encounter (Signed)
Patient called in , gave insurance info, of [place] id / [id number]   group [phone]

## 2021-12-21 ENCOUNTER — Ambulatory Visit (INDEPENDENT_AMBULATORY_CARE_PROVIDER_SITE_OTHER): Payer: BC Managed Care – PPO | Admitting: Neurology

## 2021-12-21 ENCOUNTER — Encounter: Payer: Self-pay | Admitting: Neurology

## 2021-12-21 VITALS — BP 132/82 | HR 60 | Ht 73.0 in | Wt 232.5 lb

## 2021-12-21 DIAGNOSIS — G40209 Localization-related (focal) (partial) symptomatic epilepsy and epileptic syndromes with complex partial seizures, not intractable, without status epilepticus: Secondary | ICD-10-CM | POA: Diagnosis not present

## 2021-12-21 NOTE — Progress Notes (Signed)
HISTORY OF PRESENT ILLNESS:  Todd Massey is a 51 year old male, is accompanied by his significant other Tabatha, seen in request by his primary care PA Trinna Post for evaluation of chronic headaches, recurrent spells of transient dizziness, confusion, initial evaluation was on April 28, 2020.   I have reviewed and summarized the referring note from the referring physician.  He has past medical history of hyperlipidemia. [name] has been with him for more than 5 years, as long as she could recall, patient has been complaining frequent headaches, dizziness transient confusion spells since she knows him.  The spells gradually getting worse, more frequent   He describes his spells as sudden onset overwhelming rushing throughout his body, he felt lightheaded, as if he is going to pass out, it most happened in a standing up position, but also happened in a sitting down position, it is often followed by bilateral frontal temporal region pressure headache last for a couple hours, he has been taking ibuprofen about 3 times each week for his recurrent spells and headaches over the past 1 year   On Apr 19, 2020, he got up in the morning, had breakfast, around 1030am, he bended over to throw away some weight to the trash can, he suddenly felt the family a rushing sensation throughout his body, he has to hold on the trash can, then lost consciousness, Tabatha witnessed his arm curled up, body jerking movement last about 2 minutes, followed by postevent confusion, he had right lateral tongue biting, paramedic was called, by that time, he recovered back to baseline   His friend also reported witnessed episode of sudden onset stop talking, staring into space, lasting for couple minutes   I personally reviewed MRI of the brain without contrast in March 2020, scattered supratentorium small vessel disease, no acute abnormality, MRI of the brain showed no significant abnormality   Laboratory evaluation June  2021, normal TSH, CBC, hemoglobin of 16.9, CMP showed elevated alkaline phosphate, AST 51, ALT 67,  UPDATE Dec 21 2021: He went to [place] in December, came back with COVID, had a fever, whole body achy pain for few days, in the height of COVID, he had recurrent seizure, he can feel it coming on, dizziness, numbing sensation, he was able to find a place to sit down, then passed out, was witnessed by his girlfriend, he has been compliant with his lamotrigine 100 mg twice a day, I have offered laboratory evaluation including lamotrigine level today, he is going to have blood test done by his primary care physician in few days, does not want to take higher dose of lamotrigine, since he started taking lamotrigine in 2021, he has been doing very well, no longer have frequent dizziness spells, no longer has staring spells, he works at [employer], EEG was normal  in  2021   REVIEW OF SYSTEMS: Out of a complete 14 system review of symptoms, the patient complains only of the following symptoms, and all other reviewed systems are negative.  Seizure  ALLERGIES: Allergies  Allergen Reactions   Penicillins Rash and Other (See Comments)    Has patient had a PCN reaction causing immediate rash, facial/tongue/throat swelling, SOB or lightheadedness with hypotension: yes Has patient had a PCN reaction causing severe rash involving mucus membranes or skin necrosis: no Has patient had a PCN reaction that required hospitalization no Has patient had a PCN reaction occurring within the last 10 years: no If all of the above answers are "NO", then  may proceed with Cephalosporin use.     HOME MEDICATIONS: Outpatient Medications Prior to Visit  Medication Sig Dispense Refill   aspirin 81 MG chewable tablet Chew 1 tablet (81 mg total) by mouth daily. 90 tablet 3   ibuprofen (ADVIL,MOTRIN) 200 MG tablet Take 400 mg by mouth every 6 (six) hours as needed (For headache or body aches.).     lamoTRIgine (LAMICTAL) 100 MG  tablet Take 1 tablet (100 mg total) by mouth 2 (two) times daily. 180 tablet 3   Magnesium 500 MG CAPS Take 1 capsule (500 mg total) by mouth daily. 90 capsule 3   omeprazole (PRILOSEC) 40 MG capsule Take 1 capsule (40 mg total) by mouth daily. 90 capsule 3   rosuvastatin (CRESTOR) 40 MG tablet Take 1 tablet (40 mg total) by mouth daily. 90 tablet 3   No facility-administered medications prior to visit.    PAST MEDICAL HISTORY: Past Medical History:  Diagnosis Date   Allergy    GERD (gastroesophageal reflux disease)    Headache    migraines   Migraine    Seizure-like activity ([place])     PAST SURGICAL HISTORY: Past Surgical History:  Procedure Laterality Date   APPENDECTOMY     EUS N/A 03/31/2016   Procedure: ESOPHAGEAL ENDOSCOPIC ULTRASOUND (EUS) RADIAL;  Surgeon: Arta Silence, MD;  Location: [facility];  Service: Endoscopy;  Laterality: N/A;    FAMILY HISTORY: Family History  Problem Relation Age of Onset   Diabetes Mother    Other Father        unsure of medical history    SOCIAL HISTORY: Social History   Socioeconomic History   Marital status: Single    Spouse name: Not on file   Number of children: 2   Years of education: 12   Highest education level: High school graduate  Occupational History   Occupation: produce associate    Comment: Public house manager  Tobacco Use   Smoking status: Every Day    Packs/day: 0.50    Years: 30.00    Pack years: 15.00    Types: Cigarettes   Smokeless tobacco: Never  Vaping Use   Vaping Use: Never used  Substance and Sexual Activity   Alcohol use: No   Drug use: Never   Sexual activity: Not on file  Other Topics Concern   Not on file  Social History Narrative   Lives at home with significant other.   Right-handed.   One 16oz soda per day,  16oz sweet tea per day, occasional coffee.   Social Determinants of Health   Financial Resource Strain: Not on file  Food Insecurity: Not on file  Transportation Needs: Not on  file  Physical Activity: Not on file  Stress: Not on file  Social Connections: Not on file  Intimate Partner Violence: Not on file   PHYSICAL EXAM  Vitals:   12/21/21 1451  BP: 132/82  Pulse: 60  Weight: 232 lb 8 oz (105.5 kg)  Height: 6\' 1"  (1.854 m)   Body mass index is 30.67 kg/m.   PHYSICAL EXAMNIATION:  Gen: NAD, conversant, well nourised, well groomed                     Cardiovascular: Regular rate rhythm, no peripheral edema, warm, nontender. Eyes: Conjunctivae clear without exudates or hemorrhage Neck: Supple, no carotid bruits. Pulmonary: Clear to auscultation bilaterally   NEUROLOGICAL EXAM:  MENTAL STATUS: Speech/cognition: Awake, alert, oriented to history taking and casual conversation  CRANIAL NERVES: CN II: Visual fields are full to confrontation.  Pupils are round equal and briskly reactive to light. CN III, IV, VI: extraocular movement are normal. No ptosis. CN V: Facial sensation is intact to pinprick in all 3 divisions bilaterally. Corneal responses are intact.  CN VII: Face is symmetric with normal eye closure and smile. CN VIII: Hearing is normal to casual conversation CN IX, X: Palate elevates symmetrically. Phonation is normal. CN XI: Head turning and shoulder shrug are intact CN XII: Tongue is midline with normal movements and no atrophy.  MOTOR: There is no pronator drift of out-stretched arms. Muscle bulk and tone are normal. Muscle strength is normal.  REFLEXES: Reflexes are 2+ and symmetric at the biceps, triceps, knees, and ankles. Plantar responses are flexor.  SENSORY: Intact to light touch, pinprick, positional and vibratory sensation are intact in fingers and toes.  COORDINATION: Rapid alternating movements and fine finger movements are intact. There is no dysmetria on finger-to-nose and heel-knee-shin.    GAIT/STANCE: Posture is normal. Gait is steady with normal steps, base, arm swing, and turning. Heel and toe walking are  normal. Tandem gait is normal.  Romberg is absent.     DIAGNOSTIC DATA (LABS, IMAGING, TESTING) - I reviewed patient records, labs, notes, testing and imaging myself where available.  Lab Results  Component Value Date   WBC 10.4 01/11/2021   HGB 16.2 01/11/2021   HCT 48.7 01/11/2021   MCV 87.6 01/11/2021   PLT 182 01/11/2021      Component Value Date/Time   NA 138 01/11/2021 2051   NA 137 08/04/2020 1441   NA 138 03/16/2015 1958   K 4.0 01/11/2021 2051   K 4.2 03/16/2015 1958   CL 107 01/11/2021 2051   CL 102 03/16/2015 1958   CO2 21 (L) 01/11/2021 2051   CO2 27 03/16/2015 1958   GLUCOSE 134 (H) 01/11/2021 2051   GLUCOSE 93 03/16/2015 1958   BUN 17 01/11/2021 2051   BUN 18 08/04/2020 1441   BUN 12 03/16/2015 1958   CREATININE 1.38 (H) 01/11/2021 2051   CREATININE 1.14 03/16/2015 1958   CALCIUM 8.9 01/11/2021 2051   CALCIUM 9.1 03/16/2015 1958   PROT 7.7 01/11/2021 2051   PROT 6.8 08/04/2020 1441   ALBUMIN 4.3 01/11/2021 2051   ALBUMIN 4.2 08/04/2020 1441   AST 58 (H) 01/11/2021 2051   ALT 83 (H) 01/11/2021 2051   ALKPHOS 241 (H) 01/11/2021 2051   BILITOT 0.8 01/11/2021 2051   BILITOT 0.3 08/04/2020 1441   GFRNONAA >60 01/11/2021 2051   GFRNONAA >60 03/16/2015 1958   GFRAA 90 08/04/2020 1441   GFRAA >60 03/16/2015 1958   Lab Results  Component Value Date   CHOL 215 (H) 08/31/2019   HDL 34 (L) 08/31/2019   LDLCALC 133 (H) 08/31/2019   TRIG 269 (H) 08/31/2019   CHOLHDL 6.3 (H) 08/31/2019   Lab Results  Component Value Date   HGBA1C 5.9 (H) 08/31/2019   No results found for: VITAMINB12 Lab Results  Component Value Date   TSH 1.680 04/22/2020    ASSESSMENT AND PLAN 51 y.o. year old male   Complex partial seizure with secondary generalization  -MRI of the brain was overall stable, no contrast-enhancement, multiple small round foci, most likely represents chronic microvascular ischemic change, none were new since March 2020 MRI  -EEG was normal, no  epileptiform discharge, increased beta range activity could be medication side effect  Overall doing well on lamotrigine 100 mg twice  a day, level was 3.4 in September 2021,  Most recent recurrent seizure in December 2022 during the height of his COVID infection,  -He does not want to change his medications, also informed him no driving until seizure-free for 6 months, Call clinic for recurrent seizure, Return to clinic in 1 year with nurse practitioner

## 2022-03-11 DIAGNOSIS — M25421 Effusion, right elbow: Secondary | ICD-10-CM | POA: Diagnosis not present

## 2022-03-15 DIAGNOSIS — M7521 Bicipital tendinitis, right shoulder: Secondary | ICD-10-CM | POA: Diagnosis not present

## 2022-03-23 DIAGNOSIS — M7521 Bicipital tendinitis, right shoulder: Secondary | ICD-10-CM | POA: Diagnosis not present

## 2022-04-06 DIAGNOSIS — M7521 Bicipital tendinitis, right shoulder: Secondary | ICD-10-CM | POA: Diagnosis not present

## 2022-10-26 ENCOUNTER — Emergency Department: Payer: BC Managed Care – PPO

## 2022-10-26 ENCOUNTER — Emergency Department
Admission: EM | Admit: 2022-10-26 | Discharge: 2022-10-26 | Disposition: A | Discharge status: Home / Self Care | Payer: BC Managed Care – PPO | Attending: Emergency Medicine | Admitting: Emergency Medicine

## 2022-10-26 ENCOUNTER — Other Ambulatory Visit: Payer: Self-pay

## 2022-10-26 ENCOUNTER — Encounter: Payer: Self-pay | Admitting: Emergency Medicine

## 2022-10-26 DIAGNOSIS — Z5329 Procedure and treatment not carried out because of patient's decision for other reasons: Secondary | ICD-10-CM | POA: Diagnosis not present

## 2022-10-26 DIAGNOSIS — R079 Chest pain, unspecified: Secondary | ICD-10-CM | POA: Diagnosis not present

## 2022-10-26 DIAGNOSIS — J439 Emphysema, unspecified: Secondary | ICD-10-CM | POA: Diagnosis not present

## 2022-10-26 DIAGNOSIS — R0789 Other chest pain: Secondary | ICD-10-CM | POA: Diagnosis not present

## 2022-10-26 LAB — BASIC METABOLIC PANEL
Anion gap: 9 (ref 5–15)
BUN: 14 mg/dL (ref 6–20)
CO2: 21 mmol/L — ABNORMAL LOW (ref 22–32)
Calcium: 9 mg/dL (ref 8.9–10.3)
Chloride: 109 mmol/L (ref 98–111)
Creatinine, Ser: 1.14 mg/dL (ref 0.61–1.24)
GFR, Estimated: >60 mL/min (ref >60)
Glucose, Bld: 92 mg/dL (ref 70–99)
Potassium: 4.1 mmol/L (ref 3.5–5.1)
Sodium: 139 mmol/L (ref 135–145)

## 2022-10-26 LAB — CBC
HCT: 48.8 % (ref 39.0–52.0)
Hemoglobin: 16.2 g/dL (ref 13.0–17.0)
MCH: 29.2 pg (ref 26.0–34.0)
MCHC: 33.2 g/dL (ref 30.0–36.0)
MCV: 88.1 fL (ref 80.0–100.0)
Platelets: 210 10*3/uL (ref 150–400)
RBC: 5.54 MIL/uL (ref 4.22–5.81)
RDW: 13.5 % (ref 11.5–15.5)
WBC: 10.8 10*3/uL — ABNORMAL HIGH (ref 4.0–10.5)
nRBC: 0 % (ref 0.0–0.2)

## 2022-10-26 LAB — TROPONIN I (HIGH SENSITIVITY): Troponin I (High Sensitivity): 4 ng/L (ref <18)

## 2022-10-26 MED ORDER — ACETAMINOPHEN 500 MG PO TABS: 1000.0000 mg | ORAL_TABLET | Freq: Once | ORAL | Status: DC

## 2022-10-26 MED ORDER — IBUPROFEN 400 MG PO TABS
400.0000 mg | ORAL_TABLET | Freq: Once | ORAL | Status: DC
Start: 2022-10-26 — End: 2022-10-27

## 2022-10-26 NOTE — ED Triage Notes (Signed)
Patient to ED via POV for Chest pain that started around 3pm. Patient states pain is on left side and radiates into left arm/back. Pain is worse when taking deep breath. Denies cardiac history.

## 2022-10-26 NOTE — ED Provider Notes (Signed)
[facility] Provider Note    Event Date/Time   First MD Initiated Contact with Patient 10/26/22 1924     (approximate)   History   Chest Pain   HPI  Todd Massey is a 51 y.o. male past medical history of migraines GERD who presents with chest pain.  Patient notes that for the last 3 weeks he has had some intermittent pain in the left side of his chest.  It has been coming and going but over the last several days has been worse and today has been fairly constant.  Worse with coughing and with taking deep breath.  He did have viral type symptoms last week with cough congestion.  His cough is improved he is not producing sputum no hemoptysis.  Denies exertional symptoms.  Does feel somewhat short of breath with exertion but denies exertional chest pain.  Denies nausea or diaphoresis.  No history of similar.  Pain does feel worse when he is lifting objects at work.  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  The patient denies hx of prior DVT/PE, unilateral leg pain/swelling, hormone use, recent surgery, hx of cancer, prolonged immobilization, or hemoptysis.       Past Medical History:  Diagnosis Date   Allergy    GERD (gastroesophageal reflux disease)    Headache    migraines   Migraine    Seizure-like activity ([place])     Patient Active Problem List   Diagnosis Date Noted   Seizure-like activity ([place]) 11/24/2021   Gastroesophageal reflux disease with esophagitis without hemorrhage 11/24/2021   Tobacco abuse 11/24/2021   Partial symptomatic epilepsy with complex partial seizures, not intractable, without status epilepticus ([place]) 04/28/2020   Hyperlipidemia 03/01/2019   Migraine 03/01/2019     Physical Exam  Triage Vital Signs: ED Triage Vitals [10/26/22 1648]  Enc Vitals Group     BP 138/88     Pulse Rate 77     Resp 18     Temp 98.5 F (36.9 C)     Temp Source Oral     SpO2 98 %     Weight      Height      Head Circumference      Peak  Flow      Pain Score 8     Pain Loc      Pain Edu?      Excl. in [place]?     Most recent vital signs: Vitals:   10/26/22 1648  BP: 138/88  Pulse: 77  Resp: 18  Temp: 98.5 F (36.9 C)  SpO2: 98%     General: Awake, no distress.  CV:  Good peripheral perfusion.  No peripheral edema or asymmetry Resp:  Normal effort.  No increased work of breathing lungs are clear Abd:  No distention.  Neuro:             Awake, Alert, Oriented x 3  Other:     ED Results / Procedures / Treatments  Labs (all labs ordered are listed, but only abnormal results are displayed) Labs Reviewed  BASIC METABOLIC PANEL - Abnormal; Notable for the following components:      Result Value   CO2 21 (*)    All other components within normal limits  CBC - Abnormal; Notable for the following components:   WBC 10.8 (*)    All other components within normal limits  D-DIMER, QUANTITATIVE  TROPONIN I (HIGH SENSITIVITY)  TROPONIN I (HIGH SENSITIVITY)  EKG  EKG interpretation performed by myself: NSR, nml axis, nml intervals, no acute ischemic changes    RADIOLOGY I reviewed and interpreted the CXR which does not show any acute cardiopulmonary process    PROCEDURES:  Critical Care performed: No  Procedures   MEDICATIONS ORDERED IN ED: Medications  acetaminophen (TYLENOL) tablet 1,000 mg (has no administration in time range)  ibuprofen (ADVIL) tablet 400 mg (has no administration in time range)     IMPRESSION / MDM / ASSESSMENT AND PLAN / ED COURSE  I reviewed the triage vital signs and the nursing notes.                              Patient's presentation is most consistent with acute presentation with potential threat to life or bodily function.  Differential diagnosis includes, but is not limited to, pleurisy pneumonia pneumothorax pulmonary embolism ACS  Patient is a 52 year old male with no known cardiac history presents because of chest pain.  He has had intermittent symptoms for  3 weeks but it has been more constant over the last day.  It is pleuritic radiating to the left arm not exertional.  Does have some associated shortness of breath no nausea or diaphoresis.  Patient's EKG is nonischemic first troponin is negative.  He is not tachycardic not hypoxic.  Did have some viral symptoms last week this could be pleurisy but his symptoms did start for this.  Overall symptomatology is less consistent with unstable angina or ACS.  I am concerned for possible PE given pleuritic nature.  He cannot PERC out given his age so we will obtain a D-dimer and repeat troponin.  Will treat pain with Tylenol Motrin.  Patient became angry when nursing went to redraw troponin and D-dimer.  He signed out [place].  I was not present for this conversation.    FINAL CLINICAL IMPRESSION(S) / ED DIAGNOSES   Final diagnoses:  Chest pain, unspecified type     Rx / DC Orders   ED Discharge Orders     None        Note:  This document was prepared using Dragon voice recognition software and may include unintentional dictation errors.   Rada Hay, MD 10/26/22 2045

## 2022-10-26 NOTE — ED Notes (Signed)
This RN went to attempt IV access for labs, pt refused, reports "you are not sticking my mother fucking arm with no GD needle" attempted to explained repeat labs, pt outraged, cussing, complain of long wait time, is not wanting to wait on labs, pt ripped off BP cuff, continues to cuss and argue with wife at bedside, wife attempted to get pt to stay for treatment, but then agree to leave d/t pt's disruptive behavior. Wife apologized on pt's behalf. Pt did agree to sign AMA form. Pt and wife left the department. Dr. Starleen Blue notified.

## 2022-11-04 ENCOUNTER — Other Ambulatory Visit: Payer: Self-pay | Admitting: Family Medicine

## 2022-11-04 DIAGNOSIS — K2100 Gastro-esophageal reflux disease with esophagitis, without bleeding: Secondary | ICD-10-CM

## 2022-11-04 DIAGNOSIS — R569 Unspecified convulsions: Secondary | ICD-10-CM

## 2022-11-04 DIAGNOSIS — E785 Hyperlipidemia, unspecified: Secondary | ICD-10-CM

## 2022-11-05 NOTE — Telephone Encounter (Signed)
Unable to refill per protocol, Rx request are too soon. Last refill 11/24/21 for 90 and 3 refills. Will refuse.  Requested Prescriptions  Pending Prescriptions Disp Refills   rosuvastatin (CRESTOR) 40 MG tablet [Pharmacy Med Name: Rosuvastatin Calcium 40 MG Oral Tablet] 90 tablet 3    Sig: TAKE 1 TABLET BY MOUTH DAILY     Cardiovascular:  Antilipid - Statins 2 Failed - 11/04/2022 11:18 PM      Failed - Lipid Panel in normal range within the last 12 months    Cholesterol, Total  Date Value Ref Range Status  08/31/2019 215 (H) 100 - 199 mg/dL Final   LDL Chol Calc (NIH)  Date Value Ref Range Status  08/31/2019 133 (H) 0 - 99 mg/dL Final   HDL  Date Value Ref Range Status  08/31/2019 34 (L) >39 mg/dL Final   Triglycerides  Date Value Ref Range Status  08/31/2019 269 (H) 0 - 149 mg/dL Final         Passed - Cr in normal range and within 360 days    Creatinine  Date Value Ref Range Status  03/16/2015 1.14 mg/dL Final    Comment:    0.61-1.24 NOTE: New Reference Range  01/28/15    Creatinine, Ser  Date Value Ref Range Status  10/26/2022 1.14 0.61 - 1.24 mg/dL Final         Passed - Patient is not pregnant      Passed - Valid encounter within last 12 months    Recent Outpatient Visits           11 months ago Other migraine without status migrainosus, not intractable   [facility] Gwyneth Sprout, FNP   2 years ago [place] Carles Col[place] M, [place]   3 years ago Hyperlipidemia, unspecified hyperlipidemia type   [facility] [place], Wendee Beavers, [place]   3 years ago Hospital discharge follow-up   [place], [place]   3 years ago Right low back pain, unspecified chronicity, unspecified whether sciatica present   [place], [place], PA-C               omeprazole (PRILOSEC) 40 MG capsule [Pharmacy Med Name: Omeprazole 40 MG Oral Capsule Delayed Release] 90  capsule 3    Sig: TAKE 1 CAPSULE BY MOUTH DAILY     Gastroenterology: Proton Pump Inhibitors Passed - 11/04/2022 11:18 PM      Passed - Valid encounter within last 12 months    Recent Outpatient Visits           11 months ago Other migraine without status migrainosus, not intractable   [facility] Gwyneth Sprout, FNP   2 years ago [place] Carles Col[place] M, [place]   3 years ago Hyperlipidemia, unspecified hyperlipidemia type   [facility] [place], Wendee Beavers, [place]   3 years ago Hospital discharge follow-up   [place], [place], [place]   3 years ago Right low back pain, unspecified chronicity, unspecified whether sciatica present   [place], Adriana M, PA-C               lamoTRIgine (LAMICTAL) 100 MG tablet [Pharmacy Med Name: lamoTRIgine 100 MG Oral Tablet] 180 tablet 3    Sig: TAKE 1 TABLET BY MOUTH TWICE  DAILY     Neurology:  Anticonvulsants - lamotrigine Failed - 11/04/2022 11:18 PM  Failed - ALT in normal range and within 360 days    ALT  Date Value Ref Range Status  01/11/2021 83 (H) 0 - 44 U/L Final         Failed - AST in normal range and within 360 days    AST  Date Value Ref Range Status  01/11/2021 58 (H) 15 - 41 U/L Final         Failed - Completed PHQ-2 or PHQ-9 in the last 360 days      Passed - Cr in normal range and within 360 days    Creatinine  Date Value Ref Range Status  03/16/2015 1.14 mg/dL Final    Comment:    0.61-1.24 NOTE: New Reference Range  01/28/15    Creatinine, Ser  Date Value Ref Range Status  10/26/2022 1.14 0.61 - 1.24 mg/dL Final         Passed - Valid encounter within last 12 months    Recent Outpatient Visits           11 months ago Other migraine without status migrainosus, not intractable   [facility] Gwyneth Sprout, FNP   2 years ago [place] Carles Collet M, [place]   3 years ago Hyperlipidemia, unspecified hyperlipidemia type   [facility] [place], Wendee Beavers, [place]   3 years ago [facility] discharge follow-up   [place], [place], [place]   3 years ago Right low back pain, unspecified chronicity, unspecified whether sciatica present   [place], Wendee Beavers, [place]

## 2022-11-17 ENCOUNTER — Ambulatory Visit: Payer: Self-pay | Admitting: *Deleted

## 2022-11-17 NOTE — Telephone Encounter (Signed)
  Chief Complaint: Depression, anxiety Symptoms: Feeling flat, don;t care to be with people, sleeping a lot, no appetite Frequency: 4-5 years, worsening past 3-4 months Pertinent Negatives: Patient denies suicidal ideation "Won't follow through" No physical symptoms presently.  HR elevated at times with anxiety Disposition: '[]'$ ED /'[]'$ Urgent Care (no appt availability in office) / '[x]'$ Appointment(In office/virtual)/ '[]'$  [place] [facility]/ '[]'$ Home Care/ '[]'$ Refused Recommended Disposition /'[]'$ [facility]/ '[]'$  Follow-up with PCP Additional Notes: Secured first available appt for Friday. Resource numbers provided as well as care advise. Pt verbalizes understanding.  Reason for Disposition  [1] Depression AND [2] worsening (e.g., sleeping poorly, less able to do activities of daily living)  Answer Assessment - Initial Assessment Questions 1. CONCERN: "What happened that made you call today?"     Been going on for 4-5 years, worsening past 3-4 months 2. DEPRESSION SYMPTOM SCREENING: "How are you feeling overall?" (e.g., decreased energy, increased sleeping or difficulty sleeping, difficulty concentrating, feelings of sadness, guilt, hopelessness, or worthlessness)     Flat, don't feel like being with anyone, tired all the time sleeping a lot, loss of appetite 3. RISK OF HARM - SUICIDAL IDEATION:  "Do you ever have thoughts of hurting or killing yourself?"  (e.g., yes, no, no but preoccupation with thoughts about death)   - INTENT:  "Do you have thoughts of hurting or killing yourself right NOW?" (e.g., yes, no, N/A)   - PLAN: "Do you have a specific plan for how you would do this?" (e.g., gun, knife, overdose, no plan, N/A)     At times, but would not react on 4. RISK OF HARM - HOMICIDAL IDEATION:  "Do you ever have thoughts of hurting or killing someone else?"  (e.g., yes, no, no but preoccupation with thoughts about death)   - INTENT:  "Do you have thoughts of hurting or killing someone right  NOW?" (e.g., yes, no, N/A)   - PLAN: "Do you have a specific plan for how you would do this?" (e.g., gun, knife, no plan, N/A)      No 5. FUNCTIONAL IMPAIRMENT: "How have things been going for you overall? Have you had more difficulty than usual doing your normal daily activities?"  (e.g., better, same, worse; self-care, school, work, interactions)     No 6. SUPPORT: "Who is with you now?" "Who do you live with?" "Do you have family or friends who you can talk to?"      Good support family and friends 7. THERAPIST: "Do you have a counselor or therapist? Name?"     no 8. STRESSORS: "Has there been any new stress or recent changes in your life?"     no 9. ALCOHOL USE OR SUBSTANCE USE (DRUG USE): "Do you drink alcohol or use any illegal drugs?"     Occasionally drinks 10. OTHER: "Do you have any other physical symptoms right now?" (e.g., fever)       Anxiety, kind of flat. HR elevated several times 5 times last 3-4 years.  Protocols used: Depression-A-AH

## 2022-11-19 ENCOUNTER — Ambulatory Visit (INDEPENDENT_AMBULATORY_CARE_PROVIDER_SITE_OTHER): Payer: BC Managed Care – PPO | Admitting: Family Medicine

## 2022-11-19 ENCOUNTER — Encounter: Payer: Self-pay | Admitting: Family Medicine

## 2022-11-19 VITALS — BP 142/88 | HR 77 | Temp 98.6°F

## 2022-11-19 DIAGNOSIS — F4001 Agoraphobia with panic disorder: Secondary | ICD-10-CM | POA: Diagnosis not present

## 2022-11-19 DIAGNOSIS — E785 Hyperlipidemia, unspecified: Secondary | ICD-10-CM

## 2022-11-19 DIAGNOSIS — R569 Unspecified convulsions: Secondary | ICD-10-CM

## 2022-11-19 DIAGNOSIS — K2100 Gastro-esophageal reflux disease with esophagitis, without bleeding: Secondary | ICD-10-CM

## 2022-11-19 DIAGNOSIS — F322 Major depressive disorder, single episode, severe without psychotic features: Secondary | ICD-10-CM | POA: Diagnosis not present

## 2022-11-19 MED ORDER — ROSUVASTATIN CALCIUM 40 MG PO TABS: 40.0000 mg | ORAL_TABLET | Freq: Every day | ORAL | 3 refills | Status: DC

## 2022-11-19 MED ORDER — SERTRALINE HCL 50 MG PO TABS: 50.0000 mg | ORAL_TABLET | Freq: Every day | ORAL | 3 refills | Status: DC

## 2022-11-19 MED ORDER — OMEPRAZOLE 40 MG PO CPDR: 40.0000 mg | DELAYED_RELEASE_CAPSULE | Freq: Every day | ORAL | 3 refills | Status: DC

## 2022-11-19 MED ORDER — BUSPIRONE HCL 5 MG PO TABS: 5.0000 mg | ORAL_TABLET | Freq: Three times a day (TID) | ORAL | 0 refills | Status: DC | PRN

## 2022-11-19 NOTE — Assessment & Plan Note (Signed)
Chronic, stable Request for refills

## 2022-11-19 NOTE — Progress Notes (Signed)
Established patient visit   Patient: Todd Massey   DOB: 02-22-1971   51 y.o. Male  MRN: [id number] Visit Date: 11/19/2022  Today's healthcare provider: Gwyneth Sprout, FNP  Introduced to nurse practitioner role and practice setting.  All questions answered.  Discussed provider/patient relationship and expectations.   Chief Complaint  Patient presents with   Anxiety   Subjective    HPI  Depression: Patient complains of depression. He complains of depressed mood, fatigue, feelings of worthlessness/guilt, and weight loss. Onset was approximately 4 weeks ago, gradually worsening since that time.  He denies current suicidal and homicidal plan or intent.   Family history significant for depression, anxiety, and BPD .Possible organic causes contributing are: none.  Risk factors: none Previous treatment includes  none .  Pt states that he has lost almost 20 lbs and barley eats. Pt has become suicidal, no self harm as of now.  --------------------------------------------------------------------------------------------------- . Medications: Outpatient Medications Prior to Visit  Medication Sig   aspirin 81 MG chewable tablet Chew 1 tablet (81 mg total) by mouth daily.   ibuprofen (ADVIL,MOTRIN) 200 MG tablet Take 400 mg by mouth every 6 (six) hours as needed (For headache or body aches.).   lamoTRIgine (LAMICTAL) 100 MG tablet Take 1 tablet (100 mg total) by mouth 2 (two) times daily.   Magnesium 500 MG CAPS Take 1 capsule (500 mg total) by mouth daily.   [DISCONTINUED] omeprazole (PRILOSEC) 40 MG capsule Take 1 capsule (40 mg total) by mouth daily.   [DISCONTINUED] rosuvastatin (CRESTOR) 40 MG tablet Take 1 tablet (40 mg total) by mouth daily.   No facility-administered medications prior to visit.    Review of Systems     Objective    BP (!) 142/88 (BP Location: Left Arm, Patient Position: Sitting, Cuff Size: Normal)   Pulse 77   Temp 98.6 F (37 C) (Oral)   SpO2 100%     Physical Exam Vitals and nursing note reviewed.  Constitutional:      Appearance: Normal appearance.  HENT:     Head: Normocephalic and atraumatic.  Eyes:     Pupils: Pupils are equal, round, and reactive to light.  Cardiovascular:     Rate and Rhythm: Normal rate and regular rhythm.     Pulses: Normal pulses.     Heart sounds: Normal heart sounds.  Pulmonary:     Effort: Pulmonary effort is normal.     Breath sounds: Normal breath sounds.  Musculoskeletal:        General: Normal range of motion.     Cervical back: Normal range of motion.  Skin:    General: Skin is warm and dry.     Capillary Refill: Capillary refill takes less than 2 seconds.  Neurological:     General: No focal deficit present.     Mental Status: He is alert and oriented to person, place, and time. Mental status is at baseline.  Psychiatric:        Attention and Perception: Attention normal.        Mood and Affect: Mood is anxious and depressed.        Speech: Speech normal.        Behavior: Behavior normal. Behavior is cooperative.        Thought Content: Thought content includes suicidal ideation. Thought content does not include homicidal ideation. Thought content does not include homicidal or suicidal plan.     No results found for any visits on  11/19/22.  Assessment & Plan     Problem List Items Addressed This Visit       Digestive   Gastroesophageal reflux disease with esophagitis without hemorrhage    Chronic, stable Request for refills      Relevant Medications   omeprazole (PRILOSEC) 40 MG capsule     Other   Depression, major, single episode, severe (HCC) - Primary    Acute, with complaints of suicidal thoughts Denies Suicide plan Feelings of worthlessness and concern for abrupt behaviors and reactions         Relevant Medications   sertraline (ZOLOFT) 50 MG tablet   busPIRone (BUSPAR) 5 MG tablet   Hyperlipidemia    Chronic, stable Request for refills      Relevant  Medications   rosuvastatin (CRESTOR) 40 MG tablet   Panic disorder with agoraphobia and severe panic attacks    Acute on chronic, multiple hospital visits over the years Non cardiac in nature Recommend ssri start to assist with use of buspar prn  Will Start Zoloft 50 mg daily Discussed potential side effects, incl GI upset, sexual dysfunction, increased anxiety, and SI Discussed that it can take 6-8 weeks to reach full efficacy Contracted for safety - no Suicidal Plan/HI Discussed synergistic effects of medications and therapy       Relevant Medications   sertraline (ZOLOFT) 50 MG tablet   busPIRone (BUSPAR) 5 MG tablet   Seizure-like activity (HCC)    Chronic, stable Request for refills      Return in about 6 weeks (around 12/31/2022) for annual examination, anxiety and depression.     Vonna Kotyk, FNP, have reviewed all documentation for this visit. The documentation on 11/19/22 for the exam, diagnosis, procedures, and orders are all accurate and complete.  Gwyneth Sprout, [place] [phone] (phone) [phone] (fax)  [place]

## 2022-11-19 NOTE — Assessment & Plan Note (Signed)
Acute on chronic, multiple hospital visits over the years Non cardiac in nature Recommend ssri start to assist with use of buspar prn  Will Start Zoloft 50 mg daily Discussed potential side effects, incl GI upset, sexual dysfunction, increased anxiety, and SI Discussed that it can take 6-8 weeks to reach full efficacy Contracted for safety - no Suicidal Plan/HI Discussed synergistic effects of medications and therapy

## 2022-11-19 NOTE — Assessment & Plan Note (Signed)
Acute, with complaints of suicidal thoughts Denies Suicide plan Feelings of worthlessness and concern for abrupt behaviors and reactions

## 2022-12-21 ENCOUNTER — Ambulatory Visit: Payer: BC Managed Care – PPO | Admitting: Neurology

## 2023-01-07 ENCOUNTER — Ambulatory Visit (INDEPENDENT_AMBULATORY_CARE_PROVIDER_SITE_OTHER): Payer: BC Managed Care – PPO | Admitting: Family Medicine

## 2023-01-07 ENCOUNTER — Encounter: Payer: Self-pay | Admitting: Family Medicine

## 2023-01-07 VITALS — BP 129/88 | HR 90 | Temp 98.0°F | Ht 72.0 in | Wt 222.2 lb

## 2023-01-07 DIAGNOSIS — Z1211 Encounter for screening for malignant neoplasm of colon: Secondary | ICD-10-CM

## 2023-01-07 DIAGNOSIS — R569 Unspecified convulsions: Secondary | ICD-10-CM

## 2023-01-07 DIAGNOSIS — F322 Major depressive disorder, single episode, severe without psychotic features: Secondary | ICD-10-CM

## 2023-01-07 DIAGNOSIS — Z125 Encounter for screening for malignant neoplasm of prostate: Secondary | ICD-10-CM | POA: Diagnosis not present

## 2023-01-07 DIAGNOSIS — I83813 Varicose veins of bilateral lower extremities with pain: Secondary | ICD-10-CM

## 2023-01-07 DIAGNOSIS — R7303 Prediabetes: Secondary | ICD-10-CM

## 2023-01-07 DIAGNOSIS — Z0000 Encounter for general adult medical examination without abnormal findings: Secondary | ICD-10-CM | POA: Diagnosis not present

## 2023-01-07 DIAGNOSIS — H9193 Unspecified hearing loss, bilateral: Secondary | ICD-10-CM

## 2023-01-07 DIAGNOSIS — G43809 Other migraine, not intractable, without status migrainosus: Secondary | ICD-10-CM

## 2023-01-07 DIAGNOSIS — E785 Hyperlipidemia, unspecified: Secondary | ICD-10-CM | POA: Diagnosis not present

## 2023-01-07 DIAGNOSIS — F17200 Nicotine dependence, unspecified, uncomplicated: Secondary | ICD-10-CM

## 2023-01-07 MED ORDER — SERTRALINE HCL 50 MG PO TABS: 50.0000 mg | ORAL_TABLET | Freq: Every day | ORAL | 3 refills | Status: DC

## 2023-01-07 MED ORDER — ASPIRIN 81 MG PO CHEW: 81.0000 mg | CHEWABLE_TABLET | Freq: Every day | ORAL | 3 refills | Status: AC

## 2023-01-07 MED ORDER — ROSUVASTATIN CALCIUM 40 MG PO TABS: 40.0000 mg | ORAL_TABLET | Freq: Every day | ORAL | 3 refills | Status: DC

## 2023-01-07 MED ORDER — LAMOTRIGINE 100 MG PO TABS: 100.0000 mg | ORAL_TABLET | Freq: Two times a day (BID) | ORAL | 3 refills | Status: DC

## 2023-01-07 NOTE — Assessment & Plan Note (Signed)
First reported 12/23- has now stabilized Continue same dose of zoloft to assist Pt without complaints/concern RTC as needed

## 2023-01-07 NOTE — Assessment & Plan Note (Signed)
Chronic, unchanged Continue to recommend cessation efforts to assist HTN, HLD, pre-diabetes and vascular concerns Pt agreeable to LDCT for screening Continues 1/2 ppd

## 2023-01-07 NOTE — Assessment & Plan Note (Signed)
Chronic, previously stable with diet and exercise Repeat A1c Continue to recommend balanced, lower carb meals. Smaller meal size, adding snacks. Choosing water as drink of choice and increasing purposeful exercise.

## 2023-01-07 NOTE — Patient Instructions (Signed)
The CDC recommends two doses of Shingrix (the new shingles vaccine) separated by 2 to 6 months for adults age 52 years and older. I recommend checking with your insurance plan regarding coverage for this vaccine.    

## 2023-01-07 NOTE — Progress Notes (Signed)
I,Connie R Striblin,acting as a Education administrator for Gwyneth Sprout, FNP.,have documented all relevant documentation on the behalf of Gwyneth Sprout, FNP,as directed by  Gwyneth Sprout, FNP while in the presence of Gwyneth Sprout, FNP.  Complete physical exam  Patient: Todd [place]   DOB: 1971-04-27   52 y.o. Male  MRN: [id number] Visit Date: 01/07/2023  Today's healthcare provider: Gwyneth Sprout, FNP  Introduced to nurse practitioner role and practice setting.  All questions answered.  Discussed provider/patient relationship and expectations.  Subjective    Todd Massey is a 52 y.o. male who presents today for a complete physical exam.  He reports consuming a general diet. The patient does not participate in regular exercise at present. He generally feels well. He reports sleeping well. He does have additional problems to discuss today.   HPI   Past Medical History:  Diagnosis Date   Allergy    GERD (gastroesophageal reflux disease)    Headache    migraines   Migraine    Seizure-like activity ([place])    Past Surgical History:  Procedure Laterality Date   APPENDECTOMY     EUS N/A 03/31/2016   Procedure: ESOPHAGEAL ENDOSCOPIC ULTRASOUND (EUS) RADIAL;  Surgeon: Arta Silence, MD;  Location: [facility];  Service: Endoscopy;  Laterality: N/A;   Social History   Socioeconomic History   Marital status: Single    Spouse name: Not on file   Number of children: 2   Years of education: 12   Highest education level: High school graduate  Occupational History   Occupation: produce associate    Comment: Public house manager  Tobacco Use   Smoking status: Every Day    Packs/day: 1.00    Years: 30.00    Total pack years: 30.00    Types: Cigarettes   Smokeless tobacco: Never  Vaping Use   Vaping Use: Never used  Substance and Sexual Activity   Alcohol use: Yes    Comment: occ   Drug use: Never   Sexual activity: Not on file  Other Topics Concern   Not on file  Social History Narrative   Lives  at home with significant other.   Right-handed.   One 16oz soda per day,  16oz sweet tea per day, occasional coffee.   Social Determinants of Health   Financial Resource Strain: Not on file  Food Insecurity: Not on file  Transportation Needs: Not on file  Physical Activity: Not on file  Stress: Not on file  Social Connections: Not on file  Intimate Partner Violence: Not on file   Family Status  Relation Name Status   Mother  Deceased   Father  Alive   Family History  Problem Relation Age of Onset   Diabetes Mother    Other Father        unsure of medical history   Allergies  Allergen Reactions   Penicillins Rash and Other (See Comments)    Has patient had a PCN reaction causing immediate rash, facial/tongue/throat swelling, SOB or lightheadedness with hypotension: yes Has patient had a PCN reaction causing severe rash involving mucus membranes or skin necrosis: no Has patient had a PCN reaction that required hospitalization no Has patient had a PCN reaction occurring within the last 10 years: no If all of the above answers are "NO", then may proceed with Cephalosporin use.     Patient Care Team: Gwyneth Sprout, FNP as PCP - General (Family Medicine)   Medications: Outpatient Medications Prior to  Visit  Medication Sig   busPIRone (BUSPAR) 5 MG tablet Take 1 tablet (5 mg total) by mouth 3 (three) times daily as needed.   ibuprofen (ADVIL,MOTRIN) 200 MG tablet Take 400 mg by mouth every 6 (six) hours as needed (For headache or body aches.).   Magnesium 500 MG CAPS Take 1 capsule (500 mg total) by mouth daily.   omeprazole (PRILOSEC) 40 MG capsule Take 1 capsule (40 mg total) by mouth daily.   [DISCONTINUED] aspirin 81 MG chewable tablet Chew 1 tablet (81 mg total) by mouth daily.   [DISCONTINUED] lamoTRIgine (LAMICTAL) 100 MG tablet Take 1 tablet (100 mg total) by mouth 2 (two) times daily.   [DISCONTINUED] rosuvastatin (CRESTOR) 40 MG tablet Take 1 tablet (40 mg total) by  mouth daily.   [DISCONTINUED] sertraline (ZOLOFT) 50 MG tablet Take 1 tablet (50 mg total) by mouth daily.   No facility-administered medications prior to visit.    Review of Systems   Objective    BP 129/88 (BP Location: Left Arm, Patient Position: Sitting, Cuff Size: Normal)   Pulse 90   Temp 98 F (36.7 C) (Oral)   Ht 6' (1.829 m)   Wt 222 lb 3.2 oz (100.8 kg)   SpO2 98%   BMI 30.14 kg/m    Physical Exam   Last depression screening scores    01/07/2023    3:48 PM 11/19/2022    4:03 PM 11/19/2022    3:59 PM  PHQ 2/9 Scores  PHQ - 2 Score 0 5 5  PHQ- 9 Score 0 19    Last fall risk screening    01/07/2023    3:48 PM  Fall Risk   Falls in the past year? 0  Number falls in past yr: 0  Injury with Fall? 0   Last Audit-C alcohol use screening    01/07/2023    3:49 PM  Alcohol Use Disorder Test (AUDIT)  1. How often do you have a drink containing alcohol? 1  2. How many drinks containing alcohol do you have on a typical day when you are drinking? 1  3. How often do you have six or more drinks on one occasion? 0  AUDIT-C Score 2   A score of 3 or more in women, and 4 or more in men indicates increased risk for alcohol abuse, EXCEPT if all of the points are from question 1   No results found for any visits on 01/07/23.  Assessment & Plan    Routine Health Maintenance and Physical Exam  Exercise Activities and Dietary recommendations  Goals   None     Immunization History  Administered Date(s) Administered   PFIZER(Purple Top)SARS-COV-2 Vaccination 08/25/2020, 09/15/2020   PPD Test 11/04/2015   Tdap 12/27/2018    Health Maintenance  Topic Date Due   COLONOSCOPY (Pts 45-68yr Insurance coverage will need to be confirmed)  Never done   Lung Cancer Screening  Never done   Zoster Vaccines- Shingrix (1 of 2) Never done   COVID-19 Vaccine (3 - 2023-24 season) 07/23/2022   INFLUENZA VACCINE  02/20/2023 (Originally 06/22/2022)   DTaP/Tdap/Td (2 - Td or Tdap)  12/27/2028   Hepatitis C Screening  Completed   HIV Screening  Completed   HPV VACCINES  Aged Out    Discussed health benefits of physical activity, and encouraged him to engage in regular exercise appropriate for his age and condition.  Problem List Items Addressed This Visit       Cardiovascular and Mediastinum  Migraine    Chronic, stable Continue to recommend smoking cessation Remains on low dose ASA to assist No recent complaints Continue to monitor      Relevant Medications   aspirin 81 MG chewable tablet   lamoTRIgine (LAMICTAL) 100 MG tablet   rosuvastatin (CRESTOR) 40 MG tablet   sertraline (ZOLOFT) 50 MG tablet   Other Relevant Orders   CBC with Differential/Platelet   Comprehensive Metabolic Panel (CMET)   Lipid panel   Hemoglobin A1c   TSH + free T4   Varicose veins of both lower extremities with pain    L>R; encouraged to stop smoking to assist Borderline BP Repeat LP and A1c Request for referral to vascular for additional options to assist Work on weight loss to BMI <30      Relevant Medications   aspirin 81 MG chewable tablet   rosuvastatin (CRESTOR) 40 MG tablet   Other Relevant Orders   Ambulatory referral to Vascular Surgery   CBC with Differential/Platelet   Comprehensive Metabolic Panel (CMET)   Lipid panel     Nervous and Auditory   Bilateral hearing loss    R>L; worsening Reports injury to R ear and worsening low tone sounds Request to ENT for hearing eval      Relevant Orders   Ambulatory referral to ENT     Other   Annual physical exam - Primary    UTD on vision Hx of broken and missing teeth; does not see a dentist. No complaints at this time Things to do to keep yourself healthy  - Exercise at least 30-45 minutes a day, 3-4 days a week.  - Eat a low-fat diet with lots of fruits and vegetables, up to 7-9 servings per day.  - Seatbelts can save your life. Wear them always.  - Smoke detectors on every level of your home, check  batteries every year.  - Eye Doctor - have an eye exam every 1-2 years  - Safe sex - if you may be exposed to STDs, use a condom.  - Alcohol -  If you drink, do it moderately, less than 2 drinks per day.  - [place]. Choose someone to speak for you if you are not able.  - Depression is common in our stressful [place].If you're feeling down or losing interest in things you normally enjoy, please come in for a visit.  - Violence - If anyone is threatening or hurting you, please call immediately.       Relevant Orders   CBC with Differential/Platelet   Comprehensive Metabolic Panel (CMET)   PSA   Lipid panel   Hemoglobin A1c   TSH + free T4   Depression, major, single episode, severe ([place])    First reported 12/23- has now stabilized Continue same dose of zoloft to assist Pt without complaints/concern RTC as needed       Relevant Medications   sertraline (ZOLOFT) 50 MG tablet   Hyperlipidemia    Chronic, previously elevated Now on crestor Continue to recommend smoking cessation to assist Repeat LP LDL goal of <100      Relevant Medications   aspirin 81 MG chewable tablet   rosuvastatin (CRESTOR) 40 MG tablet   Other Relevant Orders   CBC with Differential/Platelet   Comprehensive Metabolic Panel (CMET)   Prediabetes    Chronic, previously stable with diet and exercise Repeat A1c Continue to recommend balanced, lower carb meals. Smaller meal size, adding snacks. Choosing water as drink of  choice and increasing purposeful exercise.       Relevant Orders   Hemoglobin A1c   Screening for colon cancer    Recommend colonoscopy given tobacco use; pt agreeable. Referral placed      Relevant Orders   Ambulatory referral to Gastroenterology   Screening for prostate cancer    Denies LUTS; recommend PSA in place of DRE. If PSA is elevated for age, we will repeat; if PSA remains elevated pt will be referred to urology for DRE and next steps for best  treatment.       Relevant Orders   PSA   Seizure-like activity ([place])    Historical; reports no recent seizures Continue lamictal as previously prescribed       Relevant Medications   lamoTRIgine (LAMICTAL) 100 MG tablet   Tobacco dependence    Chronic, unchanged Continue to recommend cessation efforts to assist HTN, HLD, pre-diabetes and vascular concerns Pt agreeable to LDCT for screening Continues 1/2 ppd      Relevant Orders   Ambulatory Referral Lung Cancer Screening [facility]   Return in about 1 year (around 01/09/2024), or if symptoms worsen or fail to improve, for annual examination.    Vonna Kotyk, FNP, have reviewed all documentation for this visit. The documentation on 01/07/23 for the exam, diagnosis, procedures, and orders are all accurate and complete.  Gwyneth Sprout, [place] [phone] (phone) [phone] (fax)  [place]

## 2023-01-07 NOTE — Assessment & Plan Note (Signed)
Denies LUTS; recommend PSA in place of DRE. If PSA is elevated for age, we will repeat; if PSA remains elevated pt will be referred to urology for DRE and next steps for best treatment.  

## 2023-01-07 NOTE — Assessment & Plan Note (Signed)
Chronic, stable Continue to recommend smoking cessation Remains on low dose ASA to assist No recent complaints Continue to monitor

## 2023-01-07 NOTE — Assessment & Plan Note (Signed)
Chronic, previously elevated Now on crestor Continue to recommend smoking cessation to assist Repeat LP LDL goal of <100

## 2023-01-07 NOTE — Assessment & Plan Note (Signed)
R>L; worsening Reports injury to R ear and worsening low tone sounds Request to ENT for hearing eval

## 2023-01-07 NOTE — Assessment & Plan Note (Signed)
Recommend colonoscopy given tobacco use; pt agreeable. Referral placed

## 2023-01-07 NOTE — Assessment & Plan Note (Signed)
UTD on vision Hx of broken and missing teeth; does not see a dentist. No complaints at this time Things to do to keep yourself healthy  - Exercise at least 30-45 minutes a day, 3-4 days a week.  - Eat a low-fat diet with lots of fruits and vegetables, up to 7-9 servings per day.  - Seatbelts can save your life. Wear them always.  - Smoke detectors on every level of your home, check batteries every year.  - Eye Doctor - have an eye exam every 1-2 years  - Safe sex - if you may be exposed to STDs, use a condom.  - Alcohol -  If you drink, do it moderately, less than 2 drinks per day.  - [place]. Choose someone to speak for you if you are not able.  - Depression is common in our stressful world.If you're feeling down or losing interest in things you normally enjoy, please come in for a visit.  - Violence - If anyone is threatening or hurting you, please call immediately.

## 2023-01-07 NOTE — Assessment & Plan Note (Signed)
L>R; encouraged to stop smoking to assist Borderline BP Repeat LP and A1c Request for referral to vascular for additional options to assist Work on weight loss to BMI <30

## 2023-01-07 NOTE — Assessment & Plan Note (Signed)
Historical; reports no recent seizures Continue lamictal as previously prescribed

## 2023-01-08 LAB — COMPREHENSIVE METABOLIC PANEL
ALT: 41 IU/L (ref 0–44)
AST: 36 IU/L (ref 0–40)
Albumin/Globulin Ratio: 1.8 (ref 1.2–2.2)
Albumin: 4.4 g/dL (ref 3.8–4.9)
Alkaline Phosphatase: 240 IU/L — ABNORMAL HIGH (ref 44–121)
BUN/Creatinine Ratio: 14 (ref 9–20)
BUN: 17 mg/dL (ref 6–24)
Bilirubin Total: 0.4 mg/dL (ref 0.0–1.2)
CO2: 20 mmol/L (ref 20–29)
Calcium: 9 mg/dL (ref 8.7–10.2)
Chloride: 103 mmol/L (ref 96–106)
Creatinine, Ser: 1.22 mg/dL (ref 0.76–1.27)
Globulin, Total: 2.4 g/dL (ref 1.5–4.5)
Glucose: 87 mg/dL (ref 70–99)
Potassium: 4.3 mmol/L (ref 3.5–5.2)
Sodium: 139 mmol/L (ref 134–144)
Total Protein: 6.8 g/dL (ref 6.0–8.5)
eGFR: 71 mL/min/{1.73_m2} (ref >59)

## 2023-01-08 LAB — CBC WITH DIFFERENTIAL/PLATELET
Basophils Absolute: 0.2 10*3/uL (ref 0.0–0.2)
Basos: 2 %
EOS (ABSOLUTE): 0.4 10*3/uL (ref 0.0–0.4)
Eos: 4 %
Hematocrit: 47 % (ref 37.5–51.0)
Hemoglobin: 15.9 g/dL (ref 13.0–17.7)
Immature Grans (Abs): 0 10*3/uL (ref 0.0–0.1)
Immature Granulocytes: 0 %
Lymphocytes Absolute: 3.5 10*3/uL — ABNORMAL HIGH (ref 0.7–3.1)
Lymphs: 39 %
MCH: 29.7 pg (ref 26.6–33.0)
MCHC: 33.8 g/dL (ref 31.5–35.7)
MCV: 88 fL (ref 79–97)
Monocytes Absolute: 0.7 10*3/uL (ref 0.1–0.9)
Monocytes: 8 %
Neutrophils Absolute: 4.2 10*3/uL (ref 1.4–7.0)
Neutrophils: 47 %
Platelets: 201 10*3/uL (ref 150–450)
RBC: 5.35 x10E6/uL (ref 4.14–5.80)
RDW: 13.4 % (ref 11.6–15.4)
WBC: 8.9 10*3/uL (ref 3.4–10.8)

## 2023-01-08 LAB — LIPID PANEL
Chol/HDL Ratio: 4.2 ratio (ref 0.0–5.0)
Cholesterol, Total: 163 mg/dL (ref 100–199)
HDL: 39 mg/dL — ABNORMAL LOW (ref >39)
LDL Chol Calc (NIH): 74 mg/dL (ref 0–99)
Triglycerides: 310 mg/dL — ABNORMAL HIGH (ref 0–149)
VLDL Cholesterol Cal: 50 mg/dL — ABNORMAL HIGH (ref 5–40)

## 2023-01-08 LAB — TSH+FREE T4
Free T4: 1.08 ng/dL (ref 0.82–1.77)
TSH: 1.59 u[IU]/mL (ref 0.450–4.500)

## 2023-01-08 LAB — PSA: Prostate Specific Ag, Serum: 0.6 ng/mL (ref 0.0–4.0)

## 2023-01-08 LAB — HEMOGLOBIN A1C
Est. average glucose Bld gHb Est-mCnc: 123 mg/dL
Hgb A1c MFr Bld: 5.9 % — ABNORMAL HIGH (ref 4.8–5.6)

## 2023-01-09 NOTE — Progress Notes (Signed)
LDL is now at goal; I continue to recommend diet low in saturated fat and regular exercise - 30 min at least 5 times per week given elevated fats and good cholesterol. This will likely assist with alkaline phosphatase elevation as well.  Pre-diabetes remains stable at 5.9%. Continue to recommend balanced, lower carb meals. Smaller meal size, adding snacks. Choosing water as drink of choice and increasing purposeful exercise.  All other labs are stable.  Todd [place], [address] [place], [place] [address] [phone] (phone) [phone] (fax) [place]

## 2023-01-10 ENCOUNTER — Telehealth: Payer: Self-pay

## 2023-01-10 ENCOUNTER — Other Ambulatory Visit: Payer: Self-pay

## 2023-01-10 DIAGNOSIS — Z1211 Encounter for screening for malignant neoplasm of colon: Secondary | ICD-10-CM

## 2023-01-10 MED ORDER — NA SULFATE-K SULFATE-MG SULF 17.5-3.13-1.6 GM/177ML PO SOLN: 1.0000 | Freq: Once | ORAL | 0 refills | Status: AC

## 2023-01-10 NOTE — Telephone Encounter (Signed)
Pt returned call to schedule colonoscopy callback-[phone]

## 2023-01-10 NOTE — Telephone Encounter (Signed)
Gastroenterology Pre-Procedure Review  Request Date: 01/20/23 Requesting Physician: Dr. Vicente Males  PATIENT REVIEW QUESTIONS: The patient responded to the following health history questions as indicated:    1. Are you having any GI issues? no 2. Do you have a personal history of Polyps? no 3. Do you have a family history of Colon Cancer or Polyps? no 4. Diabetes Mellitus? no 5. Joint replacements in the past 12 months?no 6. Major health problems in the past 3 months?no 7. Any artificial heart valves, MVP, or defibrillator?no    MEDICATIONS & ALLERGIES:    Patient reports the following regarding taking any anticoagulation/antiplatelet therapy:   Plavix, Coumadin, Eliquis, Xarelto, Lovenox, Pradaxa, Brilinta, or Effient? no Aspirin? Yes 81 mg daily  Patient confirms/reports the following medications:  Current Outpatient Medications  Medication Sig Dispense Refill   aspirin 81 MG chewable tablet Chew 1 tablet (81 mg total) by mouth daily. 90 tablet 3   busPIRone (BUSPAR) 5 MG tablet Take 1 tablet (5 mg total) by mouth 3 (three) times daily as needed. 90 tablet 0   ibuprofen (ADVIL,MOTRIN) 200 MG tablet Take 400 mg by mouth every 6 (six) hours as needed (For headache or body aches.).     lamoTRIgine (LAMICTAL) 100 MG tablet Take 1 tablet (100 mg total) by mouth 2 (two) times daily. 180 tablet 3   Na Sulfate-K Sulfate-Mg Sulf 17.5-3.13-1.6 GM/177ML SOLN Take 1 kit by mouth once for 1 dose. 354 mL 0   omeprazole (PRILOSEC) 40 MG capsule Take 1 capsule (40 mg total) by mouth daily. 90 capsule 3   rosuvastatin (CRESTOR) 40 MG tablet Take 1 tablet (40 mg total) by mouth daily. 90 tablet 3   sertraline (ZOLOFT) 50 MG tablet Take 1 tablet (50 mg total) by mouth daily. 90 tablet 3   Magnesium 500 MG CAPS Take 1 capsule (500 mg total) by mouth daily. (Patient not taking: Reported on 01/10/2023) 90 capsule 3   No current facility-administered medications for this visit.    Patient confirms/reports the  following allergies:  Allergies  Allergen Reactions   Penicillins Rash and Other (See Comments)    Has patient had a PCN reaction causing immediate rash, facial/tongue/throat swelling, SOB or lightheadedness with hypotension: yes Has patient had a PCN reaction causing severe rash involving mucus membranes or skin necrosis: no Has patient had a PCN reaction that required hospitalization no Has patient had a PCN reaction occurring within the last 10 years: no If all of the above answers are "NO", then may proceed with Cephalosporin use.     No orders of the defined types were placed in this encounter.   AUTHORIZATION INFORMATION Primary Insurance: 1D#: Group #:  Secondary Insurance: 1D#: Group #:  SCHEDULE INFORMATION: Date: 01/20/23 Time: Location: [facility]

## 2023-01-17 ENCOUNTER — Other Ambulatory Visit: Payer: Self-pay | Admitting: Family Medicine

## 2023-01-17 DIAGNOSIS — F322 Major depressive disorder, single episode, severe without psychotic features: Secondary | ICD-10-CM

## 2023-01-19 ENCOUNTER — Encounter: Payer: Self-pay | Admitting: Gastroenterology

## 2023-01-20 ENCOUNTER — Encounter
Admission: RE | Disposition: A | Discharge status: Home / Self Care | Payer: Self-pay | Source: Ambulatory Visit | Attending: Gastroenterology

## 2023-01-20 ENCOUNTER — Ambulatory Visit: Payer: BC Managed Care – PPO | Admitting: Anesthesiology

## 2023-01-20 ENCOUNTER — Encounter: Payer: Self-pay | Admitting: Gastroenterology

## 2023-01-20 ENCOUNTER — Ambulatory Visit
Admission: RE | Admit: 2023-01-20 | Discharge: 2023-01-20 | Disposition: A | Discharge status: Home / Self Care | Payer: BC Managed Care – PPO | Source: Ambulatory Visit | Attending: Gastroenterology | Admitting: Gastroenterology

## 2023-01-20 DIAGNOSIS — K635 Polyp of colon: Secondary | ICD-10-CM | POA: Diagnosis not present

## 2023-01-20 DIAGNOSIS — F17210 Nicotine dependence, cigarettes, uncomplicated: Secondary | ICD-10-CM | POA: Insufficient documentation

## 2023-01-20 DIAGNOSIS — R7303 Prediabetes: Secondary | ICD-10-CM | POA: Insufficient documentation

## 2023-01-20 DIAGNOSIS — R569 Unspecified convulsions: Secondary | ICD-10-CM | POA: Diagnosis not present

## 2023-01-20 DIAGNOSIS — D122 Benign neoplasm of ascending colon: Secondary | ICD-10-CM | POA: Insufficient documentation

## 2023-01-20 DIAGNOSIS — K5730 Diverticulosis of large intestine without perforation or abscess without bleeding: Secondary | ICD-10-CM | POA: Diagnosis not present

## 2023-01-20 DIAGNOSIS — K219 Gastro-esophageal reflux disease without esophagitis: Secondary | ICD-10-CM | POA: Diagnosis not present

## 2023-01-20 DIAGNOSIS — Z833 Family history of diabetes mellitus: Secondary | ICD-10-CM | POA: Diagnosis not present

## 2023-01-20 DIAGNOSIS — F32A Depression, unspecified: Secondary | ICD-10-CM | POA: Diagnosis not present

## 2023-01-20 DIAGNOSIS — E785 Hyperlipidemia, unspecified: Secondary | ICD-10-CM | POA: Diagnosis not present

## 2023-01-20 DIAGNOSIS — D126 Benign neoplasm of colon, unspecified: Secondary | ICD-10-CM | POA: Diagnosis not present

## 2023-01-20 DIAGNOSIS — F322 Major depressive disorder, single episode, severe without psychotic features: Secondary | ICD-10-CM | POA: Diagnosis not present

## 2023-01-20 DIAGNOSIS — Z1211 Encounter for screening for malignant neoplasm of colon: Secondary | ICD-10-CM | POA: Diagnosis not present

## 2023-01-20 HISTORY — PX: COLONOSCOPY WITH PROPOFOL: SHX5780

## 2023-01-20 HISTORY — DX: Prediabetes: R73.03

## 2023-01-20 LAB — GLUCOSE, CAPILLARY: Glucose-Capillary: 106 mg/dL — ABNORMAL HIGH (ref 70–99)

## 2023-01-20 SURGERY — COLONOSCOPY WITH PROPOFOL
Anesthesia: General

## 2023-01-20 MED ORDER — SODIUM CHLORIDE 0.9 % IV SOLN: INTRAVENOUS | Status: DC

## 2023-01-20 MED ORDER — DEXMEDETOMIDINE HCL IN NACL 80 MCG/20ML IV SOLN
INTRAVENOUS | Status: DC | PRN
  Administered 2023-01-20 (×2): 4 ug via INTRAVENOUS

## 2023-01-20 MED ORDER — PROPOFOL 500 MG/50ML IV EMUL
INTRAVENOUS | Status: DC | PRN
  Administered 2023-01-20: 150 ug/kg/min via INTRAVENOUS

## 2023-01-20 MED ORDER — PROPOFOL 10 MG/ML IV BOLUS
INTRAVENOUS | Status: DC | PRN
  Administered 2023-01-20: 30 mg via INTRAVENOUS
  Administered 2023-01-20: 70 mg via INTRAVENOUS

## 2023-01-20 MED ORDER — LIDOCAINE HCL (CARDIAC) PF 100 MG/5ML IV SOSY
PREFILLED_SYRINGE | INTRAVENOUS | Status: DC | PRN
  Administered 2023-01-20: 50 mg via INTRAVENOUS

## 2023-01-20 NOTE — H&P (Signed)
Jonathon Bellows, MD [address], [place], [place], [place], [address] [address], [place], [place], [place], [address] Phone: [phone]  Fax: [phone]  Primary Care Physician:  Gwyneth Sprout, FNP   Pre-Procedure History & Physical: HPI:  Todd [place] is a 52 y.o. male is here for an colonoscopy.   Past Medical History:  Diagnosis Date   Allergy    GERD (gastroesophageal reflux disease)    Headache    migraines   Migraine    Seizure-like activity ([place])     Past Surgical History:  Procedure Laterality Date   APPENDECTOMY     EUS N/A 03/31/2016   Procedure: ESOPHAGEAL ENDOSCOPIC ULTRASOUND (EUS) RADIAL;  Surgeon: Arta Silence, MD;  [facility];  Service: Endoscopy;  Laterality: N/A;    Prior to Admission medications   Medication Sig Start Date End Date Taking? Authorizing Provider  aspirin 81 MG chewable tablet Chew 1 tablet (81 mg total) by mouth daily. 01/07/23   Gwyneth Sprout, FNP  busPIRone (BUSPAR) 5 MG tablet Take 1 tablet (5 mg total) by mouth 3 (three) times daily as needed. 11/19/22   Gwyneth Sprout, FNP  ibuprofen (ADVIL,MOTRIN) 200 MG tablet Take 400 mg by mouth every 6 (six) hours as needed (For headache or body aches.).    [provider]  lamoTRIgine (LAMICTAL) 100 MG tablet Take 1 tablet (100 mg total) by mouth 2 (two) times daily. 01/07/23   Gwyneth Sprout, FNP  Magnesium 500 MG CAPS Take 1 capsule (500 mg total) by mouth daily. Patient not taking: Reported on 01/10/2023 11/24/21   Gwyneth Sprout, FNP  omeprazole (PRILOSEC) 40 MG capsule Take 1 capsule (40 mg total) by mouth daily. 11/19/22   Gwyneth Sprout, FNP  rosuvastatin (CRESTOR) 40 MG tablet Take 1 tablet (40 mg total) by mouth daily. 01/07/23   Gwyneth Sprout, FNP  sertraline (ZOLOFT) 50 MG tablet Take 1 tablet (50 mg total) by mouth daily. 01/07/23   Gwyneth Sprout, FNP    Allergies as of 01/10/2023 - Review Complete 01/07/2023  Allergen Reaction Noted   Penicillins  Rash and Other (See Comments) 04/09/2015    Family History  Problem Relation Age of Onset   Diabetes Mother    Other Father        unsure of medical history    Social History   Socioeconomic History   Marital status: Single    Spouse name: Not on file   Number of children: 2   Years of education: 12   Highest education level: High school graduate  Occupational History   Occupation: produce associate    Comment: Public house manager  Tobacco Use   Smoking status: Every Day    Packs/day: 1.00    Years: 30.00    Total pack years: 30.00    Types: Cigarettes   Smokeless tobacco: Never  Vaping Use   Vaping Use: Never used  Substance and Sexual Activity   Alcohol use: Yes    Comment: occ   Drug use: Never   Sexual activity: Not on file  Other Topics Concern   Not on file  Social History Narrative   Lives at home with significant other.   Right-handed.   One 16oz soda per day,  16oz sweet tea per day, occasional coffee.   Social Determinants of Health   Financial Resource Strain: Not on file  Food Insecurity: Not on file  Transportation Needs: Not on file  Physical  Activity: Not on file  Stress: Not on file  Social Connections: Not on file  Intimate Partner Violence: Not on file    Review of Systems: See HPI, otherwise negative ROS  Physical Exam: There were no vitals taken for this visit. General:   Alert,  pleasant and cooperative in NAD Head:  Normocephalic and atraumatic. Neck:  Supple; no masses or thyromegaly. Lungs:  Clear throughout to auscultation, normal respiratory effort.    Heart:  +S1, +S2, Regular rate and rhythm, No edema. Abdomen:  Soft, nontender and nondistended. Normal bowel sounds, without guarding, and without rebound.   Neurologic:  Alert and  oriented x4;  grossly normal neurologically.  Impression/Plan: Todd [place] is here for an colonoscopy to be performed for Screening colonoscopy average risk   Risks, benefits, limitations, and  alternatives regarding  colonoscopy have been reviewed with the patient.  Questions have been answered.  All parties agreeable.   Jonathon Bellows, MD  01/20/2023, 8:39 AM

## 2023-01-20 NOTE — Anesthesia Postprocedure Evaluation (Signed)
Anesthesia Post Note  Patient: Chol Graffius  Procedure(s) Performed: COLONOSCOPY WITH PROPOFOL  Patient location during evaluation: PACU Anesthesia Type: General Level of consciousness: awake and alert, oriented and patient cooperative Pain management: pain level controlled Vital Signs Assessment: post-procedure vital signs reviewed and stable Respiratory status: spontaneous breathing, nonlabored ventilation and respiratory function stable Cardiovascular status: blood pressure returned to baseline and stable Postop Assessment: adequate PO intake Anesthetic complications: no   No notable events documented.   Last Vitals:  Vitals:   01/20/23 1039 01/20/23 1049  BP: 124/77 (!) 105/93  Pulse: 68 65  Resp: 18   Temp:    SpO2: 99% 100%    Last Pain:  Vitals:   01/20/23 1049  TempSrc:   PainSc: 0-No pain                 Darrin Nipper

## 2023-01-20 NOTE — Transfer of Care (Signed)
Immediate Anesthesia Transfer of Care Note  Patient: Todd [place]  Procedure(s) Performed: Procedure(s): COLONOSCOPY WITH PROPOFOL (N/A)  Patient Location: PACU and Endoscopy Unit  Anesthesia Type:General  Level of Consciousness: sedated  Airway & Oxygen Therapy: Patient Spontanous Breathing and Patient connected to nasal cannula oxygen  Post-op Assessment: Report given to RN and Post -op Vital signs reviewed and stable  Post vital signs: Reviewed and stable  Last Vitals:  Vitals:   01/20/23 0902 01/20/23 1029  BP: 118/88 101/65  Pulse: 83 70  Resp: 16 13  Temp: (!) 36.1 C (!) 35.8 C  SpO2: 123456 0000000    Complications: No apparent anesthesia complications

## 2023-01-20 NOTE — Anesthesia Preprocedure Evaluation (Addendum)
Anesthesia Evaluation  Patient identified by MRN, date of birth, ID band Patient awake    Reviewed: Allergy & Precautions, NPO status , Patient's Chart, lab work & pertinent test results  History of Anesthesia Complications Negative for: history of anesthetic complications  Airway Mallampati: II   Neck ROM: Full    Dental  (+) Missing, Chipped   Pulmonary Current Smoker (1 ppd)Patient did not abstain from smoking.   Pulmonary exam normal breath sounds clear to auscultation       Cardiovascular Normal cardiovascular exam Rhythm:Regular Rate:Normal  ECG 10/26/22: normal   Neuro/Psych  Headaches, Seizures - (last sz greater than 1 year ago),  PSYCHIATRIC DISORDERS  Depression    HOH    GI/Hepatic ,GERD  ,,  Endo/Other  Prediabetes   Renal/GU negative Renal ROS     Musculoskeletal   Abdominal   Peds  Hematology negative hematology ROS (+)   Anesthesia Other Findings   Reproductive/Obstetrics                             Anesthesia Physical Anesthesia Plan  ASA: 2  Anesthesia Plan: General   Post-op Pain Management:    Induction: Intravenous  PONV Risk Score and Plan: 1 and Propofol infusion, TIVA and Treatment may vary due to age or medical condition  Airway Management Planned: Natural Airway  Additional Equipment:   Intra-op Plan:   Post-operative Plan:   Informed Consent: I have reviewed the patients History and Physical, chart, labs and discussed the procedure including the risks, benefits and alternatives for the proposed anesthesia with the patient or authorized representative who has indicated his/her understanding and acceptance.       Plan Discussed with: CRNA  Anesthesia Plan Comments: (LMA/GETA backup discussed.  Patient consented for risks of anesthesia including but not limited to:  - adverse reactions to medications - damage to eyes, teeth, lips or other oral  mucosa - nerve damage due to positioning  - sore throat or hoarseness - damage to heart, brain, nerves, lungs, other parts of body or loss of life  Informed patient about role of CRNA in peri- and intra-operative care.  Patient voiced understanding.)        Anesthesia Quick Evaluation

## 2023-01-20 NOTE — Op Note (Signed)
[facility] Gastroenterology Patient Name: Kener Cabal Procedure Date: 01/20/2023 9:42 AM MRN: [id number] Account #: [id number] Date of Birth: 20-Jul-1971 Admit Type: Outpatient Age: 52 Room: [facility] ENDO ROOM 1 Gender: Male Note Status: Finalized Instrument Name: Jasper Riling [id number] Procedure:             Colonoscopy Indications:           Screening for colorectal malignant neoplasm Providers:             Jonathon Bellows MD, MD Referring MD:          Jaci Standard. Rollene Rotunda (Referring MD) Medicines:             Monitored Anesthesia Care Complications:         No immediate complications. Procedure:             Pre-Anesthesia Assessment:                        - Prior to the procedure, a History and Physical was                         performed, and patient medications, allergies and                         sensitivities were reviewed. The patient's tolerance                         of previous anesthesia was reviewed.                        - The risks and benefits of the procedure and the                         sedation options and risks were discussed with the                         patient. All questions were answered and informed                         consent was obtained.                        - ASA Grade Assessment: II - A patient with mild                         systemic disease.                        After obtaining informed consent, the colonoscope was                         passed under direct vision. Throughout the procedure,                         the patient's blood pressure, pulse, and oxygen                         saturations were monitored continuously. The                         Colonoscope was introduced  through the anus and                         advanced to the the cecum, identified by the                         appendiceal orifice. The colonoscopy was performed                         with ease. The patient tolerated the procedure well.                          The quality of the bowel preparation was adequate. The                         ileocecal valve, appendiceal orifice, and rectum were                         photographed. Findings:      The perianal and digital rectal examinations were normal.      Three sessile polyps were found in the ascending colon. The polyps were       5 to 7 mm in size. These polyps were removed with a cold snare.       Resection and retrieval were complete. To prevent bleeding after the       polypectomy, one hemostatic clip was successfully placed. There was no       bleeding at the end of the procedure.      An 11 mm polyp was found in the sigmoid colon. The polyp was sessile.       The polyp was removed with a hot snare. Resection and retrieval were       complete.      A 5 mm polyp was found in the descending colon. The polyp was sessile.       The polyp was removed with a cold snare. Resection and retrieval were       complete.      Multiple small-mouthed diverticula were found in the sigmoid colon.      The exam was otherwise without abnormality on direct and retroflexion       views. Impression:            - Three 5 to 7 mm polyps in the ascending colon,                         removed with a cold snare. Resected and retrieved.                         Clip was placed.                        - One 11 mm polyp in the sigmoid colon, removed with a                         hot snare. Resected and retrieved.                        - One 5 mm polyp in the descending colon, removed with  a cold snare. Resected and retrieved.                        - Diverticulosis in the sigmoid colon.                        - The examination was otherwise normal on direct and                         retroflexion views. Recommendation:        - Discharge patient to home (with escort).                        - Resume previous diet.                        - Continue present medications.                         - Await pathology results.                        - Repeat colonoscopy in 3 years for surveillance. Procedure Code(s):     --- Professional ---                        [phone], Colonoscopy, flexible; with removal of                         tumor(s), polyp(s), or other lesion(s) by snare                         technique Diagnosis Code(s):     --- Professional ---                        Z12.11, Encounter for screening for malignant neoplasm                         of colon                        D12.2, Benign neoplasm of ascending colon                        D12.5, Benign neoplasm of sigmoid colon                        D12.4, Benign neoplasm of descending colon                        K57.30, Diverticulosis of large intestine without                         perforation or abscess without bleeding CPT copyright 2022 American Medical Association. All rights reserved. The codes documented in this report are preliminary and upon coder review may  be revised to meet current compliance requirements. Jonathon Bellows, MD Jonathon Bellows MD, MD 01/20/2023 10:27:13 AM This report has been signed electronically. Number of Addenda: 0 Note Initiated On: 01/20/2023 9:42 AM Scope Withdrawal Time: 0 hours 21 minutes 12 seconds  Total Procedure Duration: 0 hours 31 minutes 48 seconds  Estimated Blood Loss:  Estimated blood loss: none.      [facility]

## 2023-01-21 ENCOUNTER — Encounter: Payer: Self-pay | Admitting: Gastroenterology

## 2023-01-21 LAB — SURGICAL PATHOLOGY

## 2023-01-25 ENCOUNTER — Encounter: Payer: Self-pay | Admitting: Gastroenterology

## 2023-02-01 ENCOUNTER — Other Ambulatory Visit (INDEPENDENT_AMBULATORY_CARE_PROVIDER_SITE_OTHER): Payer: Self-pay | Admitting: Nurse Practitioner

## 2023-02-01 DIAGNOSIS — I83813 Varicose veins of bilateral lower extremities with pain: Secondary | ICD-10-CM

## 2023-02-02 ENCOUNTER — Ambulatory Visit (INDEPENDENT_AMBULATORY_CARE_PROVIDER_SITE_OTHER): Payer: BC Managed Care – PPO | Admitting: Nurse Practitioner

## 2023-02-02 ENCOUNTER — Ambulatory Visit (INDEPENDENT_AMBULATORY_CARE_PROVIDER_SITE_OTHER): Payer: BC Managed Care – PPO

## 2023-02-02 ENCOUNTER — Encounter (INDEPENDENT_AMBULATORY_CARE_PROVIDER_SITE_OTHER): Payer: Self-pay | Admitting: Nurse Practitioner

## 2023-02-02 VITALS — BP 112/73 | HR 71 | Resp 18 | Ht 71.0 in | Wt 220.0 lb

## 2023-02-02 DIAGNOSIS — I83813 Varicose veins of bilateral lower extremities with pain: Secondary | ICD-10-CM

## 2023-02-02 DIAGNOSIS — E785 Hyperlipidemia, unspecified: Secondary | ICD-10-CM | POA: Diagnosis not present

## 2023-02-15 ENCOUNTER — Encounter (INDEPENDENT_AMBULATORY_CARE_PROVIDER_SITE_OTHER): Payer: Self-pay | Admitting: Nurse Practitioner

## 2023-02-15 NOTE — Progress Notes (Signed)
Subjective:    Patient ID: Todd Massey, male    DOB: Apr 01, 1971, 52 y.o.   MRN: [id number] Chief Complaint  Patient presents with   New Patient (Initial Visit)    Np consult with ven reflux Varicose veins of both lower extremities with pain    The patient is seen for evaluation of symptomatic varicose veins. The patient relates burning and stinging which worsened steadily throughout the course of the day, particularly with standing. The patient also notes an aching and throbbing pain over the varicosities, particularly with prolonged dependent positions. The symptoms are significantly improved with elevation.  The patient also notes that during hot weather the symptoms are greatly intensified. The patient states the pain from the varicose veins interferes with work, daily exercise, shopping and household maintenance. At this point, the symptoms are persistent and severe enough that they're having a negative impact on lifestyle and are interfering with daily activities.  There is no history of DVT, PE or superficial thrombophlebitis. There is no history of ulceration or hemorrhage. The patient denies a significant family history of varicose veins.  The patient has worn graduated compression for greater than 12 weeks with no relief. At the present time the patient has been using over-the-counter analgesics. There is no history of prior surgical intervention or sclerotherapy.  The patient has no evidence of deep venous insufficiency bilaterally.  No evidence of DVT or superficial thrombophlebitis bilaterally.  The patient does have reflux in the bilateral great saphenous veins.  For     Review of Systems  Cardiovascular:  Positive for leg swelling.  All other systems reviewed and are negative.      Objective:   Physical Exam Vitals reviewed.  HENT:     Head: Normocephalic.  Cardiovascular:     Rate and Rhythm: Normal rate.     Pulses: Normal pulses.  Pulmonary:     Effort:  Pulmonary effort is normal.  Musculoskeletal:        General: Tenderness present.  Skin:    General: Skin is warm and dry.  Neurological:     Mental Status: He is alert and oriented to person, place, and time.  Psychiatric:        Mood and Affect: Mood normal.        Behavior: Behavior normal.        Thought Content: Thought content normal.        Judgment: Judgment normal.     BP 112/73 (BP Location: Right Arm)   Pulse 71   Resp 18   Ht 5\' 11"  (1.803 m)   Wt 220 lb (99.8 kg)   BMI 30.68 kg/m   Past Medical History:  Diagnosis Date   Allergy    GERD (gastroesophageal reflux disease)    Headache    migraines   Migraine    Pre-diabetes    Seizure-like activity (HCC)     Social History   Socioeconomic History   Marital status: Single    Spouse name: Not on file   Number of children: 2   Years of education: 12   Highest education level: High school graduate  Occupational History   Occupation: produce associate    Comment: Public house manager  Tobacco Use   Smoking status: Every Day    Packs/day: 1.00    Years: 30.00    Additional pack years: 0.00    Total pack years: 30.00    Types: Cigarettes   Smokeless tobacco: Never  Vaping Use   Vaping  Use: Never used  Substance and Sexual Activity   Alcohol use: Yes    Comment: occ   Drug use: Never   Sexual activity: Not on file  Other Topics Concern   Not on file  Social History Narrative   Lives at home with significant other.   Right-handed.   One 16oz soda per day,  16oz sweet tea per day, occasional coffee.   Social Determinants of Health   Financial Resource Strain: Not on file  Food Insecurity: Not on file  Transportation Needs: Not on file  Physical Activity: Not on file  Stress: Not on file  Social Connections: Not on file  Intimate Partner Violence: Not on file    Past Surgical History:  Procedure Laterality Date   APPENDECTOMY     COLONOSCOPY WITH PROPOFOL N/A 01/20/2023   Procedure: COLONOSCOPY  WITH PROPOFOL;  Surgeon: Jonathon Bellows, MD;  Location: [facility] ENDOSCOPY;  Service: Gastroenterology;  Laterality: N/A;   EUS N/A 03/31/2016   Procedure: ESOPHAGEAL ENDOSCOPIC ULTRASOUND (EUS) RADIAL;  Surgeon: Arta Silence, MD;  Location: [facility] ENDOSCOPY;  Service: Endoscopy;  Laterality: N/A;    Family History  Problem Relation Age of Onset   Diabetes Mother    Other Father        unsure of medical history    Allergies  Allergen Reactions   Penicillins Rash and Other (See Comments)    Has patient had a PCN reaction causing immediate rash, facial/tongue/throat swelling, SOB or lightheadedness with hypotension: yes Has patient had a PCN reaction causing severe rash involving mucus membranes or skin necrosis: no Has patient had a PCN reaction that required hospitalization no Has patient had a PCN reaction occurring within the last 10 years: no If all of the above answers are "NO", then may proceed with Cephalosporin use.        Latest Ref Rng & Units 01/07/2023    4:23 PM 10/26/2022    4:50 PM 01/11/2021    8:51 PM  CBC  WBC 3.4 - 10.8 x10E3/uL 8.9  10.8  10.4   Hemoglobin 13.0 - 17.7 g/dL 15.9  16.2  16.2   Hematocrit 37.5 - 51.0 % 47.0  48.8  48.7   Platelets 150 - 450 x10E3/uL 201  210  182       CMP     Component Value Date/Time   NA 139 01/07/2023 1623   NA 138 03/16/2015 1958   K 4.3 01/07/2023 1623   K 4.2 03/16/2015 1958   CL 103 01/07/2023 1623   CL 102 03/16/2015 1958   CO2 20 01/07/2023 1623   CO2 27 03/16/2015 1958   GLUCOSE 87 01/07/2023 1623   GLUCOSE 92 10/26/2022 1650   GLUCOSE 93 03/16/2015 1958   BUN 17 01/07/2023 1623   BUN 12 03/16/2015 1958   CREATININE 1.22 01/07/2023 1623   CREATININE 1.14 03/16/2015 1958   CALCIUM 9.0 01/07/2023 1623   CALCIUM 9.1 03/16/2015 1958   PROT 6.8 01/07/2023 1623   ALBUMIN 4.4 01/07/2023 1623   AST 36 01/07/2023 1623   ALT 41 01/07/2023 1623   ALKPHOS 240 (H) 01/07/2023 1623   BILITOT 0.4 01/07/2023 1623   GFRNONAA  >60 10/26/2022 1650   GFRNONAA >60 03/16/2015 1958   GFRAA 90 08/04/2020 1441   GFRAA >60 03/16/2015 1958     No results found.     Assessment & Plan:   1. Varicose veins of both lower extremities with pain Recommend  I have reviewed my with the  patient regarding  varicose veins and why they cause symptoms. Patient will continue  wearing graduated compression stockings class 1 on a daily basis, beginning first thing in the morning and removing them in the evening.  The patient is CEAP [id number].  The patient has been wearing compression for more than 12 weeks with no or little benefit.  The patient has been exercising daily for more than 12 weeks. The patient has been elevating and taking OTC pain medications for more than 12 weeks.  None of these have have eliminated the pain related to the varicose veins and venous reflux or the discomfort regarding venous congestion.    In addition, behavioral modification including elevation during the day was again discussed and this will continue.  The patient has utilized over the counter pain medications and has been exercising.  However, at this time conservative therapy has not alleviated the patient's symptoms of leg pain and swelling  Recommend: laser ablation of the  left great saphenous veins to eliminate the symptoms of pain and swelling of the lower extremities caused by the severe superficial venous reflux disease.  - VAS [place] LOWER EXTREMITY VENOUS REFLUX  2. Hyperlipidemia, unspecified hyperlipidemia type Continue statin as ordered and reviewed, no changes at this time   Current Outpatient Medications on File Prior to Visit  Medication Sig Dispense Refill   aspirin 81 MG chewable tablet Chew 1 tablet (81 mg total) by mouth daily. 90 tablet 3   busPIRone (BUSPAR) 5 MG tablet Take 1 tablet (5 mg total) by mouth 3 (three) times daily as needed. 90 tablet 0   ibuprofen (ADVIL,MOTRIN) 200 MG tablet Take 400 mg by mouth every 6 (six)  hours as needed (For headache or body aches.).     lamoTRIgine (LAMICTAL) 100 MG tablet Take 1 tablet (100 mg total) by mouth 2 (two) times daily. 180 tablet 3   omeprazole (PRILOSEC) 40 MG capsule Take 1 capsule (40 mg total) by mouth daily. 90 capsule 3   rosuvastatin (CRESTOR) 40 MG tablet Take 1 tablet (40 mg total) by mouth daily. 90 tablet 3   sertraline (ZOLOFT) 50 MG tablet Take 1 tablet (50 mg total) by mouth daily. 90 tablet 3   Magnesium 500 MG CAPS Take 1 capsule (500 mg total) by mouth daily. (Patient not taking: Reported on 01/10/2023) 90 capsule 3   No current facility-administered medications on file prior to visit.    There are no Patient Instructions on file for this visit. No follow-ups on file.   Kris Hartmann, NP

## 2023-03-31 DIAGNOSIS — H903 Sensorineural hearing loss, bilateral: Secondary | ICD-10-CM | POA: Diagnosis not present

## 2023-03-31 DIAGNOSIS — H9313 Tinnitus, bilateral: Secondary | ICD-10-CM | POA: Diagnosis not present

## 2023-03-31 DIAGNOSIS — J342 Deviated nasal septum: Secondary | ICD-10-CM | POA: Diagnosis not present

## 2023-04-25 ENCOUNTER — Encounter: Payer: Self-pay | Admitting: Emergency Medicine

## 2023-06-27 ENCOUNTER — Telehealth: Payer: Self-pay | Admitting: Neurology

## 2023-06-27 NOTE — Telephone Encounter (Signed)
LVM and sent mychart msg informing pt of r/s needed for 8/6 appt- Maralyn Sago out.

## 2023-06-28 ENCOUNTER — Ambulatory Visit: Payer: BC Managed Care – PPO | Admitting: Neurology

## 2023-08-22 NOTE — Progress Notes (Unsigned)
ASSESSMENT AND PLAN 52 y.o. year old male   Complex partial seizure with secondary generalization  -MRI of the brain was overall stable, no contrast-enhancement, multiple small round foci, most likely represents chronic microvascular ischemic change, none were new since March 2020 MRI  -EEG was normal, no epileptiform discharge, increased beta range activity could be medication side effect  Overall doing well on lamotrigine 100 mg twice a day, level was 3.4 in September 2021,  Most recent recurrent seizure in December 2022 during the height of his COVID infection,  -He does not want to change his medications, also informed him no driving until seizure-free for 6 months, Call clinic for recurrent seizure, Return to clinic in 1 year with nurse practitioner    HISTORY OF PRESENT ILLNESS:  Todd Massey is a 52 year old male, is accompanied by his significant other Tabatha, seen in request by his primary care PA Jodi Marble, Adriana M for evaluation of chronic headaches, recurrent spells of transient dizziness, confusion, initial evaluation was on April 28, 2020.   I have reviewed and summarized the referring note from the referring physician.  He has past medical history of hyperlipidemia. Stephannie Peters has been with him for more than 5 years, as long as she could recall, patient has been complaining frequent headaches, dizziness transient confusion spells since she knows him.  The spells gradually getting worse, more frequent   He describes his spells as sudden onset overwhelming rushing throughout his body, he felt lightheaded, as if he is going to pass out, it most happened in a standing up position, but also happened in a sitting down position, it is often followed by bilateral frontal temporal region pressure headache last for a couple hours, he has been taking ibuprofen about 3 times each week for his recurrent spells and headaches over the past 1 year   On Apr 19, 2020, he got up in the morning,  had breakfast, around 1030am, he bended over to throw away some weight to the trash can, he suddenly felt the family a rushing sensation throughout his body, he has to hold on the trash can, then lost consciousness, Tabatha witnessed his arm curled up, body jerking movement last about 2 minutes, followed by postevent confusion, he had right lateral tongue biting, paramedic was called, by that time, he recovered back to baseline   His friend also reported witnessed episode of sudden onset stop talking, staring into space, lasting for couple minutes   I personally reviewed MRI of the brain without contrast in March 2020, scattered supratentorium small vessel disease, no acute abnormality, MRI of the brain showed no significant abnormality   Laboratory evaluation June 2021, normal TSH, CBC, hemoglobin of 16.9, CMP showed elevated alkaline phosphate, AST 51, ALT 67,  UPDATE Dec 21 2021: He went to [place] in December, came back with COVID, had a fever, whole body achy pain for few days, in the height of COVID, he had recurrent seizure, he can feel it coming on, dizziness, numbing sensation, he was able to find a place to sit down, then passed out, was witnessed by his girlfriend, he has been compliant with his lamotrigine 100 mg twice a day, I have offered laboratory evaluation including lamotrigine level today, he is going to have blood test done by his primary care physician in few days, does not want to take higher dose of lamotrigine, since he started taking lamotrigine in 2021, he has been doing very well, no longer have frequent dizziness spells, no longer has staring  spells, he works at [employer], EEG was normal  in  2021  Update August 23, 2023 SS:    REVIEW OF SYSTEMS: Out of a complete 14 system review of symptoms, the patient complains only of the following symptoms, and all other reviewed systems are negative.  Seizure  ALLERGIES: Allergies  Allergen Reactions   Penicillins Rash and Other  (See Comments)    Has patient had a PCN reaction causing immediate rash, facial/tongue/throat swelling, SOB or lightheadedness with hypotension: yes Has patient had a PCN reaction causing severe rash involving mucus membranes or skin necrosis: no Has patient had a PCN reaction that required hospitalization no Has patient had a PCN reaction occurring within the last 10 years: no If all of the above answers are "NO", then may proceed with Cephalosporin use.     HOME MEDICATIONS: Outpatient Medications Prior to Visit  Medication Sig Dispense Refill   aspirin 81 MG chewable tablet Chew 1 tablet (81 mg total) by mouth daily. 90 tablet 3   busPIRone (BUSPAR) 5 MG tablet Take 1 tablet (5 mg total) by mouth 3 (three) times daily as needed. 90 tablet 0   ibuprofen (ADVIL,MOTRIN) 200 MG tablet Take 400 mg by mouth every 6 (six) hours as needed (For headache or body aches.).     lamoTRIgine (LAMICTAL) 100 MG tablet Take 1 tablet (100 mg total) by mouth 2 (two) times daily. 180 tablet 3   Magnesium 500 MG CAPS Take 1 capsule (500 mg total) by mouth daily. (Patient not taking: Reported on 01/10/2023) 90 capsule 3   omeprazole (PRILOSEC) 40 MG capsule Take 1 capsule (40 mg total) by mouth daily. 90 capsule 3   rosuvastatin (CRESTOR) 40 MG tablet Take 1 tablet (40 mg total) by mouth daily. 90 tablet 3   sertraline (ZOLOFT) 50 MG tablet Take 1 tablet (50 mg total) by mouth daily. 90 tablet 3   No facility-administered medications prior to visit.    PAST MEDICAL HISTORY: Past Medical History:  Diagnosis Date   Allergy    GERD (gastroesophageal reflux disease)    Headache    migraines   Migraine    Pre-diabetes    Seizure-like activity (HCC)     PAST SURGICAL HISTORY: Past Surgical History:  Procedure Laterality Date   APPENDECTOMY     COLONOSCOPY WITH PROPOFOL N/A 01/20/2023   Procedure: COLONOSCOPY WITH PROPOFOL;  Surgeon: Wyline Mood, MD;  [facility] ENDOSCOPY;  Service: Gastroenterology;   Laterality: N/A;   EUS N/A 03/31/2016   Procedure: ESOPHAGEAL ENDOSCOPIC ULTRASOUND (EUS) RADIAL;  Surgeon: Willis Modena, MD;  Location: [facility] ENDOSCOPY;  Service: Endoscopy;  Laterality: N/A;    FAMILY HISTORY: Family History  Problem Relation Age of Onset   Diabetes Mother    Other Father        unsure of medical history    SOCIAL HISTORY: Social History   Socioeconomic History   Marital status: Single    Spouse name: Not on file   Number of children: 2   Years of education: 12   Highest education level: High school graduate  Occupational History   Occupation: produce associate    Comment: Designer, jewellery  Tobacco Use   Smoking status: Every Day    Current packs/day: 1.00    Average packs/day: 1 pack/day for 30.0 years (30.0 ttl pk-yrs)    Types: Cigarettes   Smokeless tobacco: Never  Vaping Use   Vaping status: Never Used  Substance and Sexual Activity   Alcohol use:  Yes    Comment: occ   Drug use: Never   Sexual activity: Not on file  Other Topics Concern   Not on file  Social History Narrative   Lives at home with significant other.   Right-handed.   One 16oz soda per day,  16oz sweet tea per day, occasional coffee.   Social Determinants of Health   Financial Resource Strain: Not on file  Food Insecurity: Not on file  Transportation Needs: Not on file  Physical Activity: Not on file  Stress: Not on file  Social Connections: Not on file  Intimate Partner Violence: Not on file   PHYSICAL EXAM  There were no vitals filed for this visit.  There is no height or weight on file to calculate BMI.   PHYSICAL EXAMNIATION:  Gen: NAD, conversant, well nourised, well groomed                     Cardiovascular: Regular rate rhythm, no peripheral edema, warm, nontender. Eyes: Conjunctivae clear without exudates or hemorrhage Neck: Supple, no carotid bruits. Pulmonary: Clear to auscultation bilaterally   NEUROLOGICAL EXAM:  MENTAL  STATUS: Speech/cognition: Awake, alert, oriented to history taking and casual conversation   CRANIAL NERVES: CN II: Visual fields are full to confrontation.  Pupils are round equal and briskly reactive to light. CN III, IV, VI: extraocular movement are normal. No ptosis. CN V: Facial sensation is intact to pinprick in all 3 divisions bilaterally. Corneal responses are intact.  CN VII: Face is symmetric with normal eye closure and smile. CN VIII: Hearing is normal to casual conversation CN IX, X: Palate elevates symmetrically. Phonation is normal. CN XI: Head turning and shoulder shrug are intact CN XII: Tongue is midline with normal movements and no atrophy.  MOTOR: There is no pronator drift of out-stretched arms. Muscle bulk and tone are normal. Muscle strength is normal.  REFLEXES: Reflexes are 2+ and symmetric at the biceps, triceps, knees, and ankles. Plantar responses are flexor.  SENSORY: Intact to light touch, pinprick, positional and vibratory sensation are intact in fingers and toes.  COORDINATION: Rapid alternating movements and fine finger movements are intact. There is no dysmetria on finger-to-nose and heel-knee-shin.    GAIT/STANCE: Posture is normal. Gait is steady with normal steps, base, arm swing, and turning. Heel and toe walking are normal. Tandem gait is normal.  Romberg is absent.     DIAGNOSTIC DATA (LABS, IMAGING, TESTING) - I reviewed patient records, labs, notes, testing and imaging myself where available.  Lab Results  Component Value Date   WBC 8.9 01/07/2023   HGB 15.9 01/07/2023   HCT 47.0 01/07/2023   MCV 88 01/07/2023   PLT 201 01/07/2023      Component Value Date/Time   NA 139 01/07/2023 1623   NA 138 03/16/2015 1958   K 4.3 01/07/2023 1623   K 4.2 03/16/2015 1958   CL 103 01/07/2023 1623   CL 102 03/16/2015 1958   CO2 20 01/07/2023 1623   CO2 27 03/16/2015 1958   GLUCOSE 87 01/07/2023 1623   GLUCOSE 92 10/26/2022 1650   GLUCOSE  93 03/16/2015 1958   BUN 17 01/07/2023 1623   BUN 12 03/16/2015 1958   CREATININE 1.22 01/07/2023 1623   CREATININE 1.14 03/16/2015 1958   CALCIUM 9.0 01/07/2023 1623   CALCIUM 9.1 03/16/2015 1958   PROT 6.8 01/07/2023 1623   ALBUMIN 4.4 01/07/2023 1623   AST 36 01/07/2023 1623   ALT 41 01/07/2023  1623   ALKPHOS 240 (H) 01/07/2023 1623   BILITOT 0.4 01/07/2023 1623   GFRNONAA >60 10/26/2022 1650   GFRNONAA >60 03/16/2015 1958   GFRAA 90 08/04/2020 1441   GFRAA >60 03/16/2015 1958   Lab Results  Component Value Date   CHOL 163 01/07/2023   HDL 39 (L) 01/07/2023   LDLCALC 74 01/07/2023   TRIG 310 (H) 01/07/2023   CHOLHDL 4.2 01/07/2023   Lab Results  Component Value Date   HGBA1C 5.9 (H) 01/07/2023   No results found for: "VITAMINB12" Lab Results  Component Value Date   TSH 1.590 01/07/2023    Otila Kluver, DNP  [facility] [address], [address] [place], [place] [address] [phone]

## 2023-08-23 ENCOUNTER — Ambulatory Visit (INDEPENDENT_AMBULATORY_CARE_PROVIDER_SITE_OTHER): Payer: BC Managed Care – PPO | Admitting: Neurology

## 2023-08-23 ENCOUNTER — Encounter: Payer: Self-pay | Admitting: Neurology

## 2023-08-23 VITALS — BP 127/78 | HR 64 | Ht 71.0 in | Wt 232.2 lb

## 2023-08-23 DIAGNOSIS — R569 Unspecified convulsions: Secondary | ICD-10-CM

## 2023-08-23 DIAGNOSIS — G40209 Localization-related (focal) (partial) symptomatic epilepsy and epileptic syndromes with complex partial seizures, not intractable, without status epilepticus: Secondary | ICD-10-CM

## 2023-08-23 MED ORDER — LAMOTRIGINE 100 MG PO TABS: 100.0000 mg | ORAL_TABLET | Freq: Two times a day (BID) | ORAL | 3 refills | Status: AC

## 2023-08-23 NOTE — Patient Instructions (Addendum)
Make sure taking Lamictal 100 mg twice daily, will check labs today, call for any seizures   Meds ordered this encounter  Medications   lamoTRIgine (LAMICTAL) 100 MG tablet    Sig: Take 1 tablet (100 mg total) by mouth 2 (two) times daily.    Dispense:  180 tablet    Refill:  3

## 2023-08-24 LAB — CBC WITH DIFFERENTIAL/PLATELET
Basophils Absolute: 0.2 10*3/uL (ref 0.0–0.2)
Basos: 2 %
EOS (ABSOLUTE): 0.3 10*3/uL (ref 0.0–0.4)
Eos: 3 %
Hematocrit: 50.6 % (ref 37.5–51.0)
Hemoglobin: 16.4 g/dL (ref 13.0–17.7)
Immature Grans (Abs): 0 10*3/uL (ref 0.0–0.1)
Immature Granulocytes: 0 %
Lymphocytes Absolute: 2.1 10*3/uL (ref 0.7–3.1)
Lymphs: 26 %
MCH: 29.4 pg (ref 26.6–33.0)
MCHC: 32.4 g/dL (ref 31.5–35.7)
MCV: 91 fL (ref 79–97)
Monocytes Absolute: 0.6 10*3/uL (ref 0.1–0.9)
Monocytes: 7 %
Neutrophils Absolute: 4.9 10*3/uL (ref 1.4–7.0)
Neutrophils: 62 %
Platelets: 210 10*3/uL (ref 150–450)
RBC: 5.58 x10E6/uL (ref 4.14–5.80)
RDW: 13.5 % (ref 11.6–15.4)
WBC: 8 10*3/uL (ref 3.4–10.8)

## 2023-08-24 LAB — COMPREHENSIVE METABOLIC PANEL
ALT: 28 [IU]/L (ref 0–44)
AST: 27 [IU]/L (ref 0–40)
Albumin: 4.4 g/dL (ref 3.8–4.9)
Alkaline Phosphatase: 225 [IU]/L — ABNORMAL HIGH (ref 44–121)
BUN/Creatinine Ratio: 13 (ref 9–20)
BUN: 16 mg/dL (ref 6–24)
Bilirubin Total: 0.6 mg/dL (ref 0.0–1.2)
CO2: 22 mmol/L (ref 20–29)
Calcium: 9.5 mg/dL (ref 8.7–10.2)
Chloride: 103 mmol/L (ref 96–106)
Creatinine, Ser: 1.27 mg/dL (ref 0.76–1.27)
Globulin, Total: 2.7 g/dL (ref 1.5–4.5)
Glucose: 89 mg/dL (ref 70–99)
Potassium: 4.8 mmol/L (ref 3.5–5.2)
Sodium: 139 mmol/L (ref 134–144)
Total Protein: 7.1 g/dL (ref 6.0–8.5)
eGFR: 68 mL/min/{1.73_m2} (ref >59)

## 2023-08-24 LAB — LAMOTRIGINE LEVEL: Lamotrigine Lvl: 3.5 ug/mL (ref 2.0–20.0)

## 2023-09-20 ENCOUNTER — Other Ambulatory Visit: Payer: Self-pay | Admitting: Family Medicine

## 2023-09-20 DIAGNOSIS — K2100 Gastro-esophageal reflux disease with esophagitis, without bleeding: Secondary | ICD-10-CM

## 2023-09-21 NOTE — Telephone Encounter (Signed)
Requested Prescriptions  Pending Prescriptions Disp Refills   omeprazole (PRILOSEC) 40 MG capsule [Pharmacy Med Name: Omeprazole 40 MG Oral Capsule Delayed Release] 90 capsule 1    Sig: TAKE 1 CAPSULE BY MOUTH DAILY     Gastroenterology: Proton Pump Inhibitors Passed - 09/20/2023 10:48 PM      Passed - Valid encounter within last 12 months    Recent Outpatient Visits           8 months ago Annual physical exam   [facility] Merita Norton T, FNP   10 months ago Depression, major, single episode, severe [facility])   [facility] Merita Norton T, FNP   1 year ago Other migraine without status migrainosus, not intractable   [facility] Jacky Kindle, FNP   3 years ago Dizziness    [facility] Osvaldo Angst M, [place]   4 years ago Hyperlipidemia, unspecified hyperlipidemia type   [facility] [place], Ricki Rodriguez [place], [place]

## 2023-10-07 ENCOUNTER — Encounter: Payer: Self-pay | Admitting: Family Medicine

## 2023-10-07 ENCOUNTER — Ambulatory Visit
Admission: RE | Admit: 2023-10-07 | Discharge: 2023-10-07 | Disposition: A | Discharge status: Home / Self Care | Payer: BC Managed Care – PPO | Source: Ambulatory Visit | Attending: Family Medicine | Admitting: Family Medicine

## 2023-10-07 ENCOUNTER — Ambulatory Visit (INDEPENDENT_AMBULATORY_CARE_PROVIDER_SITE_OTHER): Payer: BC Managed Care – PPO | Admitting: Family Medicine

## 2023-10-07 VITALS — BP 111/68 | HR 87 | Wt 232.7 lb

## 2023-10-07 DIAGNOSIS — M47816 Spondylosis without myelopathy or radiculopathy, lumbar region: Secondary | ICD-10-CM | POA: Diagnosis not present

## 2023-10-07 DIAGNOSIS — S3992XA Unspecified injury of lower back, initial encounter: Secondary | ICD-10-CM | POA: Diagnosis not present

## 2023-10-07 DIAGNOSIS — W19XXXA Unspecified fall, initial encounter: Secondary | ICD-10-CM | POA: Diagnosis not present

## 2023-10-07 MED ORDER — TRAMADOL HCL 50 MG PO TABS: 50.0000 mg | ORAL_TABLET | Freq: Three times a day (TID) | ORAL | 0 refills | Status: AC | PRN

## 2023-10-07 MED ORDER — CYCLOBENZAPRINE HCL 10 MG PO TABS: 10.0000 mg | ORAL_TABLET | Freq: Every day | ORAL | 0 refills | Status: DC

## 2023-10-07 NOTE — Patient Instructions (Signed)
VISIT SUMMARY:  Today, you were seen for severe lower back pain following a fall on some steps. The pain is constant and severe, rated 8 out of 10, and is not relieved by Extra Strength Tylenol. You have significant bruising and tenderness in the lower back area, and you are experiencing difficulty standing up straight.  YOUR PLAN:  -ACUTE LOWER BACK PAIN DUE TO FALL: Acute lower back pain is a sudden onset of pain in the lower back, often due to injury. In your case, it is likely caused by a fall, resulting in bruising and tenderness. We have ordered lumbar x-rays to rule out any fractures. You are prescribed tramadol 50-100 mg every 8 hours as needed for pain relief for 7 days and Flexeril 10 mg at bedtime to help with muscle spasms. You should alternate between applying heat and ice to the affected area to reduce pain and swelling. Please remain home from work until 10/12/2023, and a work note has been provided.  INSTRUCTIONS:  Please follow up with the results of your lumbar x-rays. If your pain does not improve or worsens, or if you experience any new symptoms such as urinary incontinence or numbness, please seek medical attention immediately.

## 2023-10-07 NOTE — Progress Notes (Signed)
Established patient visit   Patient: Todd Massey   DOB: 16-Sep-1971   52 y.o. Male  MRN: [id number] Visit Date: 10/07/2023  Today's healthcare provider: Ronnald Ramp, MD   Chief Complaint  Patient presents with   Fall    Patient fell yesterday evening after slipping on some wet steps. Steps were wooden associated with lower back pain. Also reports hitting his back and left arm where he scrapped his lower arm. Reports he has pain regardless of how he is positioned and unable to stand completely straight. Pain in is a 8/10. Patient has taken tylenol extra strength x 2. Last taken this morning about 9:00 am with no relief.    Subjective     HPI     Fall    Additional comments: Patient fell yesterday evening after slipping on some wet steps. Steps were wooden associated with lower back pain. Also reports hitting his back and left arm where he scrapped his lower arm. Reports he has pain regardless of how he is positioned and unable to stand completely straight. Pain in is a 8/10. Patient has taken tylenol extra strength x 2. Last taken this morning about 9:00 am with no relief.       Last edited by Acey Lav, CMA on 10/07/2023  2:27 PM.       Discussed the use of AI scribe software for clinical note transcription with the patient, who gave verbal consent to proceed.  History of Present Illness   The patient, a 52 year old male, presents with severe lower back pain following a fall on some steps. The pain, rated as 8 out of 10 in severity, is constant and does not improve with changes in position. The patient reports difficulty standing up straight due to the pain. He has tried managing the pain with Extra Strength Tylenol, taken earlier in the day, but reports no improvement.  Upon physical examination, the patient exhibits significant bruising on the left lumbar into the sacral region of the back. The pain is most severe in the midline of the lumbar region, where  bruising is also present. The patient describes the pain as a sharp, burning sensation, likened to being poked with a hot poker.  The patient denies any urinary incontinence but reports not having had a bowel movement since the fall. He also denies any head injury during the fall. The patient works as an [employer] manager, frequently lifting heavy bags of produce, a task he currently cannot perform due to the pain.         Past Medical History:  Diagnosis Date   Allergy    GERD (gastroesophageal reflux disease)    Headache    migraines   Migraine    Pre-diabetes    Seizure-like activity (HCC)     Medications: Outpatient Medications Prior to Visit  Medication Sig   aspirin 81 MG chewable tablet Chew 1 tablet (81 mg total) by mouth daily.   busPIRone (BUSPAR) 5 MG tablet Take 1 tablet (5 mg total) by mouth 3 (three) times daily as needed.   ibuprofen (ADVIL,MOTRIN) 200 MG tablet Take 400 mg by mouth every 6 (six) hours as needed (For headache or body aches.).   lamoTRIgine (LAMICTAL) 100 MG tablet Take 1 tablet (100 mg total) by mouth 2 (two) times daily.   Magnesium 500 MG CAPS Take 1 capsule (500 mg total) by mouth daily.   omeprazole (PRILOSEC) 40 MG capsule TAKE 1 CAPSULE BY MOUTH DAILY  rosuvastatin (CRESTOR) 40 MG tablet Take 1 tablet (40 mg total) by mouth daily.   sertraline (ZOLOFT) 50 MG tablet Take 1 tablet (50 mg total) by mouth daily.   No facility-administered medications prior to visit.    Review of Systems      Objective    BP 111/68 (BP Location: Right Arm, Patient Position: Sitting, Cuff Size: Normal)   Pulse 87   Wt 232 lb 11.2 oz (105.6 kg)   SpO2 99%   BMI 32.46 kg/m  BP Readings from Last 3 Encounters:  10/07/23 111/68  08/23/23 127/78  02/02/23 112/73   Wt Readings from Last 3 Encounters:  10/07/23 232 lb 11.2 oz (105.6 kg)  08/23/23 232 lb 3.2 oz (105.3 kg)  02/02/23 220 lb (99.8 kg)        Physical Exam  Physical Exam    MUSCULOSKELETAL: Diffuse tenderness in left lumbar into sacral region. Tenderness in midline of lumbar region. SKIN: Bruising in left lumbar into sacral region and midline of lumbar region.       No results found for any visits on 10/07/23.  Assessment & Plan     Problem List Items Addressed This Visit       Other   Injury of back due to fall - Primary   Relevant Medications   cyclobenzaprine (FLEXERIL) 10 MG tablet   traMADol (ULTRAM) 50 MG tablet   Other Relevant Orders   DG Lumbar Spine 2-3 Views    Assessment and Plan    Acute Lower Back Pain due to Fall Severe (8/10) acute lower back pain following a fall on steps. Bruising and tenderness in the left lumbar into sacral region and midline lumbar region. No urinary incontinence or head injury. Differential includes soft tissue injury, contusion, and possible fracture. Pain unrelieved by acetaminophen. Discussed tramadol and Flexeril, including risks of drowsiness and dependency. Advised alternating heat and ice to prevent hematoma formation. Patient prefers to avoid muscle relaxers during the day due to drowsiness. - Order stat lumbar x-rays - Prescribe tramadol 50-100 mg every 8 hours as needed for 7 days - Prescribe Flexeril 10 mg at bedtime for muscle spasms - Recommend alternating heat and ice for pain and swelling - Advise remaining home from work until 10/12/2023 and provide work note.         Return in about 10 days (around 10/17/2023) for back pain/injury return to work .         Ronnald Ramp, MD  [facility] [phone] (phone) [phone] (fax)  [facility]

## 2023-10-18 ENCOUNTER — Ambulatory Visit (INDEPENDENT_AMBULATORY_CARE_PROVIDER_SITE_OTHER): Payer: BC Managed Care – PPO | Admitting: Family Medicine

## 2023-10-18 VITALS — BP 137/86 | HR 84 | Ht 71.0 in | Wt 236.5 lb

## 2023-10-18 DIAGNOSIS — F339 Major depressive disorder, recurrent, unspecified: Secondary | ICD-10-CM

## 2023-10-18 DIAGNOSIS — F17200 Nicotine dependence, unspecified, uncomplicated: Secondary | ICD-10-CM

## 2023-10-18 DIAGNOSIS — Z6832 Body mass index (BMI) 32.0-32.9, adult: Secondary | ICD-10-CM | POA: Insufficient documentation

## 2023-10-18 DIAGNOSIS — R7303 Prediabetes: Secondary | ICD-10-CM | POA: Diagnosis not present

## 2023-10-18 DIAGNOSIS — M5442 Lumbago with sciatica, left side: Secondary | ICD-10-CM | POA: Diagnosis not present

## 2023-10-18 MED ORDER — GABAPENTIN 300 MG PO CAPS: ORAL_CAPSULE | ORAL | 0 refills | Status: DC

## 2023-10-18 MED ORDER — PREDNISONE 10 MG PO TABS: ORAL_TABLET | ORAL | 0 refills | Status: AC

## 2023-10-18 NOTE — Progress Notes (Signed)
Established patient visit   Patient: Todd Massey   DOB: [birth year]   52 y.o. Male  MRN: [id number] Visit Date: 10/18/2023  Today's healthcare provider: Jacky Kindle, FNP  Introduced to nurse practitioner role and practice setting.  All questions answered.  Discussed provider/patient relationship and expectations.  Chief Complaint  Patient presents with   Follow-up    Pt stated--lower back still have sharp, burning pain specially when moving certain way.   Subjective    HPI HPI     Follow-up    Additional comments: Pt stated--lower back still have sharp, burning pain specially when moving certain way.      Last edited by Shelly Bombard, CMA on 10/18/2023  2:19 PM.      Medications: Outpatient Medications Prior to Visit  Medication Sig   aspirin 81 MG chewable tablet Chew 1 tablet (81 mg total) by mouth daily.   cyclobenzaprine (FLEXERIL) 10 MG tablet Take 1 tablet (10 mg total) by mouth at bedtime.   ibuprofen (ADVIL,MOTRIN) 200 MG tablet Take 400 mg by mouth every 6 (six) hours as needed (For headache or body aches.).   lamoTRIgine (LAMICTAL) 100 MG tablet Take 1 tablet (100 mg total) by mouth 2 (two) times daily.   Magnesium 500 MG CAPS Take 1 capsule (500 mg total) by mouth daily.   omeprazole (PRILOSEC) 40 MG capsule TAKE 1 CAPSULE BY MOUTH DAILY   rosuvastatin (CRESTOR) 40 MG tablet Take 1 tablet (40 mg total) by mouth daily.   sertraline (ZOLOFT) 50 MG tablet Take 1 tablet (50 mg total) by mouth daily.   [DISCONTINUED] busPIRone (BUSPAR) 5 MG tablet Take 1 tablet (5 mg total) by mouth 3 (three) times daily as needed.   No facility-administered medications prior to visit.    Review of Systems Last CBC Lab Results  Component Value Date   WBC 8.0 08/23/2023   HGB 16.4 08/23/2023   HCT 50.6 08/23/2023   MCV 91 08/23/2023   MCH 29.4 08/23/2023   RDW 13.5 08/23/2023   PLT 210 08/23/2023   Last metabolic panel Lab Results  Component Value Date   GLUCOSE  89 08/23/2023   NA 139 08/23/2023   K 4.8 08/23/2023   CL 103 08/23/2023   CO2 22 08/23/2023   BUN 16 08/23/2023   CREATININE 1.27 08/23/2023   EGFR 68 08/23/2023   CALCIUM 9.5 08/23/2023   PROT 7.1 08/23/2023   ALBUMIN 4.4 08/23/2023   LABGLOB 2.7 08/23/2023   AGRATIO 1.8 01/07/2023   BILITOT 0.6 08/23/2023   ALKPHOS 225 (H) 08/23/2023   AST 27 08/23/2023   ALT 28 08/23/2023   ANIONGAP 9 10/26/2022   Last lipids Lab Results  Component Value Date   CHOL 163 01/07/2023   HDL 39 (L) 01/07/2023   LDLCALC 74 01/07/2023   TRIG 310 (H) 01/07/2023   CHOLHDL 4.2 01/07/2023   Last hemoglobin A1c Lab Results  Component Value Date   HGBA1C 5.9 (H) 01/07/2023   Last thyroid functions Lab Results  Component Value Date   TSH 1.590 01/07/2023     Objective    BP 137/86 (BP Location: Right Arm, Patient Position: Sitting, Cuff Size: Normal)   Pulse 84   Ht 5\' 11"  (1.803 m)   Wt 236 lb 8 oz (107.3 kg)   SpO2 97%   BMI 32.99 kg/m   BP Readings from Last 3 Encounters:  10/18/23 137/86  10/07/23 111/68  08/23/23 127/78   Wt Readings from  Last 3 Encounters:  10/18/23 236 lb 8 oz (107.3 kg)  10/07/23 232 lb 11.2 oz (105.6 kg)  08/23/23 232 lb 3.2 oz (105.3 kg)   SpO2 Readings from Last 3 Encounters:  10/18/23 97%  10/07/23 99%  01/20/23 100%   Physical Exam Vitals and nursing note reviewed.  Constitutional:      Appearance: Normal appearance. He is obese.  HENT:     Head: Normocephalic and atraumatic.  Cardiovascular:     Rate and Rhythm: Normal rate and regular rhythm.     Pulses: Normal pulses.     Heart sounds: Normal heart sounds.  Pulmonary:     Effort: Pulmonary effort is normal.     Breath sounds: Normal breath sounds.  Abdominal:     Tenderness: There is abdominal tenderness.     Comments: Flank ecchymosis of L side from back to mid abdomen.   Musculoskeletal:        General: Normal range of motion.     Cervical back: Normal range of motion.  Skin:     General: Skin is warm and dry.     Capillary Refill: Capillary refill takes less than 2 seconds.  Neurological:     General: No focal deficit present.     Mental Status: He is alert and oriented to person, place, and time. Mental status is at baseline.  Psychiatric:        Mood and Affect: Mood normal.        Behavior: Behavior normal.        Thought Content: Thought content normal.        Judgment: Judgment normal.     No results found for any visits on 10/18/23.  Assessment & Plan     Problem List Items Addressed This Visit       Nervous and Auditory   Acute left-sided low back pain with left-sided sciatica - Primary    Acute, improved with residual pain and bruising       Relevant Medications   predniSONE (DELTASONE) 10 MG tablet   gabapentin (NEURONTIN) 300 MG capsule     Other   BMI 32.0-32.9,adult    Body mass index is 32.99 kg/m. Discussed importance of healthy weight management Discussed diet and exercise       Depression, major, single episode, severe (HCC)    Chronic, recurrent Improved with zoloft 25 mg       Prediabetes    Chronic, unknown Due for repeat labs; pt declines today Continue to recommend balanced, lower carb meals. Smaller meal size, adding snacks. Choosing water as drink of choice and increasing purposeful exercise.       Tobacco dependence    Chronic, stable Remains stable with pre contemplation of cessation efforts       Reviewed imaging on 11/15- which was negative for acute injury.  FINDINGS: Five lumbar type vertebral bodies. Mild levocurvature. No listhesis. Vertebral body heights are preserved. No evidence of acute fracture. Disc degenerative changes at T12-L1. Lower lumbar facet arthropathy.   IMPRESSION: 1. No acute fracture or traumatic listhesis. 2. Lower lumbar facet arthropathy.     Electronically Signed   By: Wiliam Ke M.D.   On: 10/07/2023 17:41  Pt seen by Dr Roxan Hockey on 11/15 for fall; resolving  ecchymosis to flank. Some numbness remains at site and down affected leg.  Will trial prednisone to assist with gabapentin at bedtime. Continue ES Tylenol 1000 mg TID PRN and defer additional refills of tramadol at this time.  Previous pain rating of 8/10; now down to 2/10. Continues to have burning nerve like pain.   Pt has returned to work without concerns.   Return in about 3 months (around 01/18/2024), or if symptoms worsen or fail to improve, for chonic disease management.     Leilani Merl, FNP, have reviewed all documentation for this visit. The documentation on 10/18/23 for the exam, diagnosis, procedures, and orders are all accurate and complete.  Jacky Kindle, FNP  [facility] [phone] (phone) [phone] (fax)  [facility]

## 2023-10-18 NOTE — Assessment & Plan Note (Signed)
Chronic, unknown Due for repeat labs; pt declines today Continue to recommend balanced, lower carb meals. Smaller meal size, adding snacks. Choosing water as drink of choice and increasing purposeful exercise.

## 2023-10-18 NOTE — Assessment & Plan Note (Signed)
Chronic, stable Remains stable with pre contemplation of cessation efforts

## 2023-10-18 NOTE — Assessment & Plan Note (Signed)
Body mass index is 32.99 kg/m. Discussed importance of healthy weight management Discussed diet and exercise

## 2023-10-18 NOTE — Assessment & Plan Note (Signed)
Chronic, recurrent Improved with zoloft 25 mg

## 2023-10-18 NOTE — Assessment & Plan Note (Signed)
Acute, improved with residual pain and bruising

## 2023-11-21 ENCOUNTER — Telehealth: Payer: Self-pay | Admitting: Family Medicine

## 2023-11-27 ENCOUNTER — Other Ambulatory Visit: Payer: Self-pay | Admitting: Family Medicine

## 2023-11-27 DIAGNOSIS — E785 Hyperlipidemia, unspecified: Secondary | ICD-10-CM

## 2023-12-06 ENCOUNTER — Telehealth (INDEPENDENT_AMBULATORY_CARE_PROVIDER_SITE_OTHER): Payer: Self-pay

## 2023-12-06 NOTE — Telephone Encounter (Signed)
 Patient called stating that he is having pain in his inner left thigh caused by bulging varicose veins. He stated his feet and toes are turning purple and cold.   Patient was seen 02/02/23 by Orvin and she recommended him having Laser ablation, but he stated no one called to schedule him.   Please advise

## 2023-12-06 NOTE — Telephone Encounter (Signed)
 I am working on the PA for the laser.

## 2023-12-06 NOTE — Telephone Encounter (Signed)
 Patient stated its something new that started about couple of months ago.

## 2023-12-06 NOTE — Telephone Encounter (Signed)
 WE can have someone reach out about the laser, however are his feet and blue toes new or was this happening when he saw [place] in March?

## 2023-12-06 NOTE — Telephone Encounter (Signed)
 Voice mail was left.  Patient is at work, he told me leave a voicemail and he'll call back if needed. Patient gets off at 3:00 pm if you call about the laser procedure.

## 2023-12-21 ENCOUNTER — Telehealth (INDEPENDENT_AMBULATORY_CARE_PROVIDER_SITE_OTHER): Payer: Self-pay | Admitting: Vascular Surgery

## 2023-12-21 ENCOUNTER — Other Ambulatory Visit (INDEPENDENT_AMBULATORY_CARE_PROVIDER_SITE_OTHER): Payer: Self-pay

## 2023-12-21 MED ORDER — ALPRAZOLAM 0.5 MG PO TABS: 0.5000 mg | ORAL_TABLET | ORAL | 0 refills | Status: DC

## 2023-12-21 NOTE — Telephone Encounter (Signed)
LVM for pt TCB and schedule appt for laser ablation  left leg laser. see jd. Berkley Harvey #[id number] exp: 1.31.25 - 4.30.25   1 week post left leg laser   4 week post left leg laser. see jd/fb

## 2023-12-21 NOTE — Telephone Encounter (Signed)
Completed and sent to provider

## 2023-12-21 NOTE — Telephone Encounter (Signed)
Patient is scheduled for laser ablation on 2.21.25 with Dr. Wyn Quaker. Patient will need the standard RX protocol called in to [facility] on [address] and [address] here in [place]. Thank you.

## 2024-01-09 ENCOUNTER — Telehealth: Payer: Self-pay | Admitting: Family Medicine

## 2024-01-09 ENCOUNTER — Other Ambulatory Visit: Payer: Self-pay

## 2024-01-09 DIAGNOSIS — F322 Major depressive disorder, single episode, severe without psychotic features: Secondary | ICD-10-CM

## 2024-01-09 NOTE — Telephone Encounter (Signed)
[facility] faxed refill request for the following medications:   sertraline (ZOLOFT) 50 MG tablet    Please advise.

## 2024-01-10 MED ORDER — SERTRALINE HCL 50 MG PO TABS: 50.0000 mg | ORAL_TABLET | Freq: Every day | ORAL | 0 refills | Status: DC

## 2024-01-13 ENCOUNTER — Encounter (INDEPENDENT_AMBULATORY_CARE_PROVIDER_SITE_OTHER): Payer: Self-pay | Admitting: Vascular Surgery

## 2024-01-13 ENCOUNTER — Ambulatory Visit (INDEPENDENT_AMBULATORY_CARE_PROVIDER_SITE_OTHER): Payer: BC Managed Care – PPO | Admitting: Vascular Surgery

## 2024-01-13 VITALS — BP 122/75 | HR 73 | Resp 16 | Wt 233.4 lb

## 2024-01-13 DIAGNOSIS — I83813 Varicose veins of bilateral lower extremities with pain: Secondary | ICD-10-CM | POA: Diagnosis not present

## 2024-01-13 NOTE — Progress Notes (Signed)
Todd Massey is a 53 y.o. male who presents with symptomatic venous reflux  Past Medical History:  Diagnosis Date   Allergy    GERD (gastroesophageal reflux disease)    Headache    migraines   Migraine    Pre-diabetes    Seizure-like activity (HCC)     Past Surgical History:  Procedure Laterality Date   APPENDECTOMY     COLONOSCOPY WITH PROPOFOL N/A 01/20/2023   Procedure: COLONOSCOPY WITH PROPOFOL;  Surgeon: Wyline Mood, MD;  Location: [facility] ENDOSCOPY;  Service: Gastroenterology;  Laterality: N/A;   EUS N/A 03/31/2016   Procedure: ESOPHAGEAL ENDOSCOPIC ULTRASOUND (EUS) RADIAL;  Surgeon: Willis Modena, MD;  Location: [facility] ENDOSCOPY;  Service: Endoscopy;  Laterality: N/A;     Current Outpatient Medications:    ALPRAZolam (XANAX) 0.5 MG tablet, Take 1 tablet (0.5 mg total) by mouth as directed. Take 1st tablet 1 hour prior to arrival and take 2nd tablet upon arriving to office, Disp: 2 tablet, Rfl: 0   aspirin 81 MG chewable tablet, Chew 1 tablet (81 mg total) by mouth daily., Disp: 90 tablet, Rfl: 3   cyclobenzaprine (FLEXERIL) 10 MG tablet, Take 1 tablet (10 mg total) by mouth at bedtime., Disp: 20 tablet, Rfl: 0   gabapentin (NEURONTIN) 300 MG capsule, Take 1-2 tablets as needed for back pain., Disp: 60 capsule, Rfl: 0   ibuprofen (ADVIL,MOTRIN) 200 MG tablet, Take 400 mg by mouth every 6 (six) hours as needed (For headache or body aches.)., Disp: , Rfl:    lamoTRIgine (LAMICTAL) 100 MG tablet, Take 1 tablet (100 mg total) by mouth 2 (two) times daily., Disp: 180 tablet, Rfl: 3   Magnesium 500 MG CAPS, Take 1 capsule (500 mg total) by mouth daily., Disp: 90 capsule, Rfl: 3   omeprazole (PRILOSEC) 40 MG capsule, TAKE 1 CAPSULE BY MOUTH DAILY, Disp: 90 capsule, Rfl: 1   rosuvastatin (CRESTOR) 40 MG tablet, TAKE 1 TABLET BY MOUTH DAILY, Disp: 90 tablet, Rfl: 3   sertraline (ZOLOFT) 50 MG tablet, Take 1 tablet (50 mg total) by mouth daily., Disp: 90 tablet, Rfl: 0  Allergies  Allergen  Reactions   Penicillins Rash and Other (See Comments)    Has patient had a PCN reaction causing immediate rash, facial/tongue/throat swelling, SOB or lightheadedness with hypotension: yes Has patient had a PCN reaction causing severe rash involving mucus membranes or skin necrosis: no Has patient had a PCN reaction that required hospitalization no Has patient had a PCN reaction occurring within the last 10 years: no If all of the above answers are "NO", then may proceed with Cephalosporin use.      Varicose veins of both lower extremities with pain     PLAN: The patient's left lower extremity was sterilely prepped and draped. The ultrasound machine was used to visualize the saphenous vein throughout its course. A segment at the knee were the large tortuous varicosity joined the GSV was selected for access. The saphenous vein was accessed without difficulty using ultrasound guidance with a micropuncture needle. A 0.018 wire was then placed beyond the saphenofemoral junction and the needle was removed. The 65 cm sheath was then placed over the wire and the wire and dilator were removed. The laser fiber was then placed through the sheath and its tip was placed approximately 5 centimeters below the saphenofemoral junction. Tumescent anesthesia was then created with a dilute lidocaine solution. Laser energy was then delivered with constant withdrawal of the sheath and laser fiber. Approximately 1330 joules of  energy were delivered over a length of 29 centimeters using a 1470 Hz VenaCure machine at 7 W. Sterile dressings were placed. The patient tolerated the procedure well without obvious complications.   Follow-up in 1 week with post-laser duplex.

## 2024-01-16 ENCOUNTER — Encounter (INDEPENDENT_AMBULATORY_CARE_PROVIDER_SITE_OTHER): Payer: Self-pay | Admitting: Vascular Surgery

## 2024-01-19 ENCOUNTER — Ambulatory Visit: Payer: BC Managed Care – PPO | Admitting: Family Medicine

## 2024-01-20 ENCOUNTER — Ambulatory Visit (INDEPENDENT_AMBULATORY_CARE_PROVIDER_SITE_OTHER): Payer: BC Managed Care – PPO

## 2024-01-20 DIAGNOSIS — I83813 Varicose veins of bilateral lower extremities with pain: Secondary | ICD-10-CM | POA: Diagnosis not present

## 2024-02-10 ENCOUNTER — Ambulatory Visit (INDEPENDENT_AMBULATORY_CARE_PROVIDER_SITE_OTHER): Payer: BC Managed Care – PPO | Admitting: Nurse Practitioner

## 2024-02-10 ENCOUNTER — Encounter (INDEPENDENT_AMBULATORY_CARE_PROVIDER_SITE_OTHER): Payer: Self-pay | Admitting: Nurse Practitioner

## 2024-02-10 VITALS — BP 142/89 | HR 76 | Resp 18 | Ht 71.0 in | Wt 237.0 lb

## 2024-02-10 DIAGNOSIS — I83812 Varicose veins of left lower extremities with pain: Secondary | ICD-10-CM | POA: Diagnosis not present

## 2024-02-12 NOTE — Progress Notes (Signed)
 Subjective:    Patient ID: Todd Massey, male    DOB: [birth year], 53 y.o.   MRN: [id number] Chief Complaint  Patient presents with   Follow-up    4 week post left leg laser    The patient returns to the office for followup status post laser ablation of the left great  saphenous vein on 01/13/2024.  The patient note significant improvement in the lower extremity pain but not resolution/ of the symptoms. The patient notes multiple residual varicosities bilaterally which continued to hurt with dependent positions and remained tender to palpation. The patient's /swelling is minimally from preoperative status. The patient continues to wear graduated compression stockings on a daily basis but these are not eliminating the pain and discomfort. The patient continues to use over-the-counter anti-inflammatory medications to treat the pain and related symptoms but this has not given the patient relief. The patient notes the pain in the lower extremities is causing problems with daily exercise, problems at work and even with household activities such as preparing meals and doing dishes.  The patient is otherwise done well and there have been no complications related to the laser procedure or interval changes in the patient's overall   Post laser ultrasound shows successful ablation of the left gsv      Review of Systems  Cardiovascular:  Positive for leg swelling.  All other systems reviewed and are negative.      Objective:   Physical Exam Vitals reviewed.  HENT:     Head: Normocephalic.  Cardiovascular:     Rate and Rhythm: Normal rate.  Pulmonary:     Effort: Pulmonary effort is normal.  Musculoskeletal:        General: Tenderness present.  Skin:    General: Skin is warm and dry.  Neurological:     Mental Status: He is alert and oriented to person, place, and time.  Psychiatric:        Mood and Affect: Mood normal.        Behavior: Behavior normal.        Thought Content: Thought  content normal.        Judgment: Judgment normal.     BP (!) 142/89   Pulse 76   Resp 18   Ht 5\' 11"  (1.803 m)   Wt 237 lb (107.5 kg)   BMI 33.05 kg/m   Past Medical History:  Diagnosis Date   Allergy    GERD (gastroesophageal reflux disease)    Headache    migraines   Migraine    Pre-diabetes    Seizure-like activity (HCC)     Social History   Socioeconomic History   Marital status: Single    Spouse name: Not on file   Number of children: 2   Years of education: 12   Highest education level: 12th grade  Occupational History   Occupation: produce associate    Comment: Designer, jewellery  Tobacco Use   Smoking status: Every Day    Current packs/day: 1.00    Average packs/day: 1 pack/day for 30.0 years (30.0 ttl pk-yrs)    Types: Cigarettes   Smokeless tobacco: Never  Vaping Use   Vaping status: Never Used  Substance and Sexual Activity   Alcohol use: Yes    Comment: occ   Drug use: Never   Sexual activity: Not on file  Other Topics Concern   Not on file  Social History Narrative   Lives at home with significant other.   Right-handed.  One 16oz soda per day,  16oz sweet tea per day, occasional coffee.   Social Drivers of Health   Financial Resource Strain: Medium Risk (10/18/2023)   Overall Financial Resource Strain (CARDIA)    Difficulty of Paying Living Expenses: Somewhat hard  Food Insecurity: Patient Declined (10/18/2023)   Hunger Vital Sign    Worried About Running Out of Food in the Last Year: Patient declined    Ran Out of Food in the Last Year: Patient declined  Transportation Needs: No Transportation Needs (10/18/2023)   PRAPARE - Administrator, Civil Service (Medical): No    Lack of Transportation (Non-Medical): No  Physical Activity: Unknown (10/18/2023)   Exercise Vital Sign    Days of Exercise per Week: Patient declined    Minutes of Exercise per Session: Not on file  Stress: No Stress Concern Present (10/18/2023)   [employer] of Occupational Health - Occupational Stress Questionnaire    Feeling of Stress : Not at all  Social Connections: Moderately Isolated (10/18/2023)   Social Connection and Isolation Panel [NHANES]    Frequency of Communication with Friends and Family: More than three times a week    Frequency of Social Gatherings with Friends and Family: More than three times a week    Attends Religious Services: Never    Database administrator or Organizations: No    Attends Engineer, structural: Not on file    Marital Status: Living with partner  Intimate Partner Violence: Not on file    Past Surgical History:  Procedure Laterality Date   APPENDECTOMY     COLONOSCOPY WITH PROPOFOL N/A 01/20/2023   Procedure: COLONOSCOPY WITH PROPOFOL;  Surgeon: Wyline Mood, MD;  Location: [facility] ENDOSCOPY;  Service: Gastroenterology;  Laterality: N/A;   EUS N/A 03/31/2016   Procedure: ESOPHAGEAL ENDOSCOPIC ULTRASOUND (EUS) RADIAL;  Surgeon: Willis Modena, MD;  Location: [facility] ENDOSCOPY;  Service: Endoscopy;  Laterality: N/A;    Family History  Problem Relation Age of Onset   Diabetes Mother    Other Father        unsure of medical history    Allergies  Allergen Reactions   Penicillins Rash and Other (See Comments)    Has patient had a PCN reaction causing immediate rash, facial/tongue/throat swelling, SOB or lightheadedness with hypotension: yes Has patient had a PCN reaction causing severe rash involving mucus membranes or skin necrosis: no Has patient had a PCN reaction that required hospitalization no Has patient had a PCN reaction occurring within the last 10 years: no If all of the above answers are "NO", then may proceed with Cephalosporin use.        Latest Ref Rng & Units 08/23/2023    8:51 AM 01/07/2023    4:23 PM 10/26/2022    4:50 PM  CBC  WBC 3.4 - 10.8 x10E3/uL 8.0  8.9  10.8   Hemoglobin 13.0 - 17.7 g/dL 16.1  09.6  04.5   Hematocrit 37.5 - 51.0 % 50.6  47.0  48.8   Platelets  150 - 450 x10E3/uL 210  201  210       CMP     Component Value Date/Time   NA 139 08/23/2023 0851   NA 138 03/16/2015 1958   K 4.8 08/23/2023 0851   K 4.2 03/16/2015 1958   CL 103 08/23/2023 0851   CL 102 03/16/2015 1958   CO2 22 08/23/2023 0851   CO2 27 03/16/2015 1958   GLUCOSE 89 08/23/2023 0851  GLUCOSE 92 10/26/2022 1650   GLUCOSE 93 03/16/2015 1958   BUN 16 08/23/2023 0851   BUN 12 03/16/2015 1958   CREATININE 1.27 08/23/2023 0851   CREATININE 1.14 03/16/2015 1958   CALCIUM 9.5 08/23/2023 0851   CALCIUM 9.1 03/16/2015 1958   PROT 7.1 08/23/2023 0851   ALBUMIN 4.4 08/23/2023 0851   AST 27 08/23/2023 0851   ALT 28 08/23/2023 0851   ALKPHOS 225 (H) 08/23/2023 0851   BILITOT 0.6 08/23/2023 0851   EGFR 68 08/23/2023 0851   GFRNONAA >60 10/26/2022 1650   GFRNONAA >60 03/16/2015 1958     No results found.     Assessment & Plan:   1. Varicose veins of left lower extremity with pain (Primary) Recommend:  The patient has had successful ablation of the previously incompetent saphenous venous system but still has persistent symptoms of pain and swelling that are having a negative impact on daily life and daily activities.  Patient should undergo injection foam sclerotherapy to treat the residual varicosities.  The risks, benefits and alternative therapies were reviewed in detail with the patient.  All questions were answered.  The patient agrees to proceed with foam sclerotherapy at their convenience.  The patient will continue wearing the graduated compression stockings and using the over-the-counter pain medications to treat her symptoms.        Current Outpatient Medications on File Prior to Visit  Medication Sig Dispense Refill   aspirin 81 MG chewable tablet Chew 1 tablet (81 mg total) by mouth daily. 90 tablet 3   ibuprofen (ADVIL,MOTRIN) 200 MG tablet Take 400 mg by mouth every 6 (six) hours as needed (For headache or body aches.).     lamoTRIgine  (LAMICTAL) 100 MG tablet Take 1 tablet (100 mg total) by mouth 2 (two) times daily. 180 tablet 3   Magnesium 500 MG CAPS Take 1 capsule (500 mg total) by mouth daily. 90 capsule 3   omeprazole (PRILOSEC) 40 MG capsule TAKE 1 CAPSULE BY MOUTH DAILY 90 capsule 1   rosuvastatin (CRESTOR) 40 MG tablet TAKE 1 TABLET BY MOUTH DAILY 90 tablet 3   sertraline (ZOLOFT) 50 MG tablet Take 1 tablet (50 mg total) by mouth daily. 90 tablet 0   ALPRAZolam (XANAX) 0.5 MG tablet Take 1 tablet (0.5 mg total) by mouth as directed. Take 1st tablet 1 hour prior to arrival and take 2nd tablet upon arriving to office 2 tablet 0   cyclobenzaprine (FLEXERIL) 10 MG tablet Take 1 tablet (10 mg total) by mouth at bedtime. 20 tablet 0   gabapentin (NEURONTIN) 300 MG capsule Take 1-2 tablets as needed for back pain. 60 capsule 0   No current facility-administered medications on file prior to visit.    There are no Patient Instructions on file for this visit. No follow-ups on file.   Georgiana Spinner, NP

## 2024-02-14 ENCOUNTER — Other Ambulatory Visit: Payer: Self-pay | Admitting: Family Medicine

## 2024-02-14 DIAGNOSIS — K2100 Gastro-esophageal reflux disease with esophagitis, without bleeding: Secondary | ICD-10-CM

## 2024-02-14 NOTE — Telephone Encounter (Signed)
 [facility] is requesting refill omeprazole (PRILOSEC) 40 MG capsule  Please advise

## 2024-03-05 ENCOUNTER — Telehealth: Payer: Self-pay | Admitting: Family Medicine

## 2024-03-05 ENCOUNTER — Other Ambulatory Visit: Payer: Self-pay | Admitting: Family Medicine

## 2024-03-05 DIAGNOSIS — K2100 Gastro-esophageal reflux disease with esophagitis, without bleeding: Secondary | ICD-10-CM

## 2024-03-05 DIAGNOSIS — F322 Major depressive disorder, single episode, severe without psychotic features: Secondary | ICD-10-CM

## 2024-03-05 NOTE — Telephone Encounter (Signed)
 [facility] is requesting refill omeprazole (PRILOSEC) 40 MG capsule  Please advise

## 2024-03-06 MED ORDER — OMEPRAZOLE 40 MG PO CPDR: 40.0000 mg | DELAYED_RELEASE_CAPSULE | Freq: Every day | ORAL | 1 refills | Status: DC

## 2024-03-06 NOTE — Telephone Encounter (Signed)
 Requested medication (s) are due for refill today: na   Requested medication (s) are on the active medication list: yes   Last refill:  01/10/24 #90 0 refills  Future visit scheduled: no   Notes to clinic:  last OV 10/18/23 with Edmon Gosling, FNP. No refills remain. Do you want to refill Rx?     Requested Prescriptions  Pending Prescriptions Disp Refills   sertraline (ZOLOFT) 50 MG tablet [Pharmacy Med Name: Sertraline HCl 50 MG Oral Tablet] 90 tablet 3    Sig: TAKE 1 TABLET BY MOUTH DAILY     Psychiatry:  Antidepressants - SSRI - sertraline Failed - 03/06/2024  4:23 PM      Failed - Valid encounter within last 6 months    Recent Outpatient Visits   None            Passed - AST in normal range and within 360 days    AST  Date Value Ref Range Status  08/23/2023 27 0 - 40 IU/L Final         Passed - ALT in normal range and within 360 days    ALT  Date Value Ref Range Status  08/23/2023 28 0 - 44 IU/L Final         Passed - Completed PHQ-2 or PHQ-9 in the last 360 days

## 2024-04-10 ENCOUNTER — Encounter (INDEPENDENT_AMBULATORY_CARE_PROVIDER_SITE_OTHER): Payer: Self-pay

## 2024-04-24 ENCOUNTER — Ambulatory Visit (INDEPENDENT_AMBULATORY_CARE_PROVIDER_SITE_OTHER): Admitting: Vascular Surgery

## 2024-04-24 ENCOUNTER — Encounter (INDEPENDENT_AMBULATORY_CARE_PROVIDER_SITE_OTHER): Payer: Self-pay | Admitting: Vascular Surgery

## 2024-04-24 VITALS — BP 112/70 | HR 94 | Resp 16 | Wt 218.8 lb

## 2024-04-24 DIAGNOSIS — I83813 Varicose veins of bilateral lower extremities with pain: Secondary | ICD-10-CM

## 2024-04-24 NOTE — Progress Notes (Signed)
  Todd Massey is a 53 y.o.male who presents with painful varicose veins of the left leg  Past Medical History:  Diagnosis Date   Allergy    GERD (gastroesophageal reflux disease)    Headache    migraines   Migraine    Pre-diabetes    Seizure-like activity (HCC)     Past Surgical History:  Procedure Laterality Date   APPENDECTOMY     COLONOSCOPY WITH PROPOFOL  N/A 01/20/2023   Procedure: COLONOSCOPY WITH PROPOFOL ;  Surgeon: Luke Salaam, MD;  Location: [facility] ENDOSCOPY;  Service: Gastroenterology;  Laterality: N/A;   EUS N/A 03/31/2016   Procedure: ESOPHAGEAL ENDOSCOPIC ULTRASOUND (EUS) RADIAL;  Surgeon: Evangeline Hilts, MD;  Location: [facility] ENDOSCOPY;  Service: Endoscopy;  Laterality: N/A;    Current Outpatient Medications  Medication Sig Dispense Refill   ALPRAZolam  (XANAX ) 0.5 MG tablet Take 1 tablet (0.5 mg total) by mouth as directed. Take 1st tablet 1 hour prior to arrival and take 2nd tablet upon arriving to office 2 tablet 0   aspirin  81 MG chewable tablet Chew 1 tablet (81 mg total) by mouth daily. 90 tablet 3   cyclobenzaprine  (FLEXERIL ) 10 MG tablet Take 1 tablet (10 mg total) by mouth at bedtime. 20 tablet 0   gabapentin  (NEURONTIN ) 300 MG capsule Take 1-2 tablets as needed for back pain. 60 capsule 0   ibuprofen  (ADVIL ,MOTRIN ) 200 MG tablet Take 400 mg by mouth every 6 (six) hours as needed (For headache or body aches.).     lamoTRIgine  (LAMICTAL ) 100 MG tablet Take 1 tablet (100 mg total) by mouth 2 (two) times daily. 180 tablet 3   Magnesium  500 MG CAPS Take 1 capsule (500 mg total) by mouth daily. 90 capsule 3   omeprazole  (PRILOSEC) 40 MG capsule Take 1 capsule (40 mg total) by mouth daily. 90 capsule 1   rosuvastatin  (CRESTOR ) 40 MG tablet TAKE 1 TABLET BY MOUTH DAILY 90 tablet 3   sertraline  (ZOLOFT ) 50 MG tablet TAKE 1 TABLET BY MOUTH DAILY 90 tablet 0   No current facility-administered medications for this visit.    Allergies  Allergen Reactions   Penicillins Rash  and Other (See Comments)    Has patient had a PCN reaction causing immediate rash, facial/tongue/throat swelling, SOB or lightheadedness with hypotension: yes Has patient had a PCN reaction causing severe rash involving mucus membranes or skin necrosis: no Has patient had a PCN reaction that required hospitalization no Has patient had a PCN reaction occurring within the last 10 years: no If all of the above answers are "NO", then may proceed with Cephalosporin use.     Indication: Patient presents with symptomatic varicose veins of the left lower extremity.  Procedure: Foam sclerotherapy was performed on the left lower extremity. Using ultrasound guidance, 5 mL of foam Sotradecol was used to inject the varicosities of the left lower extremity. Compression wraps were placed. The patient tolerated the procedure well.

## 2024-05-12 ENCOUNTER — Other Ambulatory Visit: Payer: Self-pay | Admitting: Family Medicine

## 2024-05-12 DIAGNOSIS — F322 Major depressive disorder, single episode, severe without psychotic features: Secondary | ICD-10-CM

## 2024-06-04 ENCOUNTER — Telehealth: Payer: Self-pay

## 2024-06-04 NOTE — Telephone Encounter (Signed)
 Advised of no open appointments and recommended UC   Copied from CRM [id number]. Topic: Clinical - Medical Advice >> Jun 04, 2024 11:04 AM Chasity T wrote: Reason for CRM: [name] is calling in for patient because he Is having a lot of back pain and feels like kidney infection. No appointments were open to schedule please call patient back to discuss symptoms.

## 2024-06-14 ENCOUNTER — Telehealth (INDEPENDENT_AMBULATORY_CARE_PROVIDER_SITE_OTHER): Payer: Self-pay | Admitting: Vascular Surgery

## 2024-06-14 NOTE — Telephone Encounter (Signed)
 LVM for pt TCBa nd schedule FOAM sclero appt with Dr. Marea  left leg FOAM sclero. see jd. shara #[phone] - 2 units total.

## 2024-06-20 ENCOUNTER — Encounter: Payer: Self-pay | Admitting: Family Medicine

## 2024-06-20 ENCOUNTER — Ambulatory Visit (INDEPENDENT_AMBULATORY_CARE_PROVIDER_SITE_OTHER): Admitting: Family Medicine

## 2024-06-20 VITALS — BP 125/84 | HR 72 | Temp 98.2°F | Ht 71.0 in | Wt 215.8 lb

## 2024-06-20 DIAGNOSIS — R630 Anorexia: Secondary | ICD-10-CM | POA: Diagnosis not present

## 2024-06-20 DIAGNOSIS — Z0000 Encounter for general adult medical examination without abnormal findings: Secondary | ICD-10-CM | POA: Diagnosis not present

## 2024-06-20 DIAGNOSIS — K2100 Gastro-esophageal reflux disease with esophagitis, without bleeding: Secondary | ICD-10-CM

## 2024-06-20 DIAGNOSIS — R1319 Other dysphagia: Secondary | ICD-10-CM

## 2024-06-20 DIAGNOSIS — R634 Abnormal weight loss: Secondary | ICD-10-CM | POA: Diagnosis not present

## 2024-06-20 DIAGNOSIS — Z130 Encounter for screening for diseases of the blood and blood-forming organs and certain disorders involving the immune mechanism: Secondary | ICD-10-CM

## 2024-06-20 DIAGNOSIS — F17200 Nicotine dependence, unspecified, uncomplicated: Secondary | ICD-10-CM

## 2024-06-20 DIAGNOSIS — E782 Mixed hyperlipidemia: Secondary | ICD-10-CM | POA: Diagnosis not present

## 2024-06-20 DIAGNOSIS — R351 Nocturia: Secondary | ICD-10-CM

## 2024-06-20 DIAGNOSIS — R7303 Prediabetes: Secondary | ICD-10-CM | POA: Diagnosis not present

## 2024-06-20 DIAGNOSIS — F322 Major depressive disorder, single episode, severe without psychotic features: Secondary | ICD-10-CM

## 2024-06-20 DIAGNOSIS — G43809 Other migraine, not intractable, without status migrainosus: Secondary | ICD-10-CM

## 2024-06-20 NOTE — Patient Instructions (Signed)
 It was a pleasure to see you today!  Thank you for choosing [facility] for your primary care.   Today you were seen for your annual physical  Please review the attached information regarding helpful preventive health topics.   To keep you healthy, please keep in mind the following health maintenance items that you are due for:   Health Maintenance Due  Topic Date Due   Pneumococcal Vaccine 49-53 Years old (1 of 2 - PCV) Never done   Hepatitis B Vaccines (1 of 3 - 19+ 3-dose series) Never done   Lung Cancer Screening  Never done   Zoster Vaccines- Shingrix (1 of 2) Never done   COVID-19 Vaccine (3 - 2024-25 season) 07/24/2023     Best Wishes,   Dr. Lang

## 2024-06-20 NOTE — Progress Notes (Signed)
 Complete physical exam   Patient: Todd Massey   DOB: Oct 11, 1971   53 y.o. Male  MRN: [id number] Visit Date: 06/20/2024  Today's healthcare provider: Rockie Agent, MD   Chief Complaint  Patient presents with   Annual Exam    Diet- General, unhealthy Exercise-  States he exercises enough at work.   No to all vaccines Lung Cancer Screening- declined   Subjective    Todd Massey is a 53 y.o. male who presents today for a complete physical exam.    He does have additional problems to discuss today.   Discussed the use of AI scribe software for clinical note transcription with the patient, who gave verbal consent to proceed.  History of Present Illness Todd Massey is a 53 year old male who presents for an annual physical exam and cancer screening.  He has a significant history of tobacco use, smoking half a pack to a pack per day for approximately 30 years, equating to about 15 pack years.  He experiences unintentional weight loss and gastrointestinal symptoms, including anorexia, nausea, and occasional dysphagia. Nausea occurs particularly in the mornings, and when he does feel hungry, he experiences an increased appetite. He has a history of fatty liver diagnosed through ultrasound and blood tests. He recalls past episodes of food impaction in the esophagus, requiring medical intervention, but manages this by chewing food thoroughly.  He reports lower back pain associated with a past fall, exacerbated by certain movements such as sitting up from a leaned-over position. He also experiences nocturia, urinating two to three times a night.  He seldom consumes alcohol and only takes ibuprofen  or similar medications when necessary. He denies vomiting but experiences a 'wheezing feeling' in his stomach. He has a history of sessile polyps in the colon, with a follow-up colonoscopy due in 2027.     Past Medical History:  Diagnosis Date   Allergy    GERD  (gastroesophageal reflux disease)    Headache    migraines   Migraine    Pre-diabetes    Seizure-like activity (HCC)    Past Surgical History:  Procedure Laterality Date   APPENDECTOMY     COLONOSCOPY WITH PROPOFOL  N/A 01/20/2023   Procedure: COLONOSCOPY WITH PROPOFOL ;  Surgeon: Therisa Bi, MD;  Location: [facility] ENDOSCOPY;  Service: Gastroenterology;  Laterality: N/A;   EUS N/A 03/31/2016   Procedure: ESOPHAGEAL ENDOSCOPIC ULTRASOUND (EUS) RADIAL;  Surgeon: Elsie Cree, MD;  Location: [facility] ENDOSCOPY;  Service: Endoscopy;  Laterality: N/A;   Social History   Socioeconomic History   Marital status: Single    Spouse name: Not on file   Number of children: 2   Years of education: 12   Highest education level: 12th grade  Occupational History   Occupation: produce associate    Comment: Designer, jewellery  Tobacco Use   Smoking status: Every Day    Current packs/day: 1.00    Average packs/day: 1 pack/day for 30.0 years (30.0 ttl pk-yrs)    Types: Cigarettes   Smokeless tobacco: Never  Vaping Use   Vaping status: Never Used  Substance and Sexual Activity   Alcohol use: Yes    Comment: occ   Drug use: Never   Sexual activity: Not on file  Other Topics Concern   Not on file  Social History Narrative   Lives at home with significant other.   Right-handed.   One 16oz soda per day,  16oz sweet tea per day, occasional coffee.   Social Drivers of  Health   Financial Resource Strain: Medium Risk (10/18/2023)   Overall Financial Resource Strain (CARDIA)    Difficulty of Paying Living Expenses: Somewhat hard  Food Insecurity: Patient Declined (10/18/2023)   Hunger Vital Sign    Worried About Running Out of Food in the Last Year: Patient declined    Ran Out of Food in the Last Year: Patient declined  Transportation Needs: No Transportation Needs (10/18/2023)   PRAPARE - Administrator, Civil Service (Medical): No    Lack of Transportation (Non-Medical): No  Physical  Activity: Unknown (10/18/2023)   Exercise Vital Sign    Days of Exercise per Week: Patient declined    Minutes of Exercise per Session: Not on file  Stress: No Stress Concern Present (10/18/2023)   [employer] of Occupational Health - Occupational Stress Questionnaire    Feeling of Stress : Not at all  Social Connections: Moderately Isolated (10/18/2023)   Social Connection and Isolation Panel    Frequency of Communication with Friends and Family: More than three times a week    Frequency of Social Gatherings with Friends and Family: More than three times a week    Attends Religious Services: Never    Database administrator or Organizations: No    Attends Engineer, structural: Not on file    Marital Status: Living with partner  Intimate Partner Violence: Not on file   Family Status  Relation Name Status   Mother  Deceased   Father  Alive  No partnership data on file   Family History  Problem Relation Age of Onset   Diabetes Mother    Other Father        unsure of medical history   Allergies  Allergen Reactions   Penicillins Rash and Other (See Comments)    Has patient had a PCN reaction causing immediate rash, facial/tongue/throat swelling, SOB or lightheadedness with hypotension: yes Has patient had a PCN reaction causing severe rash involving mucus membranes or skin necrosis: no Has patient had a PCN reaction that required hospitalization no Has patient had a PCN reaction occurring within the last 10 years: no If all of the above answers are NO, then may proceed with Cephalosporin use.      Medications: Outpatient Medications Prior to Visit  Medication Sig   aspirin  81 MG chewable tablet Chew 1 tablet (81 mg total) by mouth daily.   lamoTRIgine  (LAMICTAL ) 100 MG tablet Take 1 tablet (100 mg total) by mouth 2 (two) times daily.   Magnesium  500 MG CAPS Take 1 capsule (500 mg total) by mouth daily.   omeprazole  (PRILOSEC) 40 MG capsule Take 1 capsule (40  mg total) by mouth daily.   rosuvastatin  (CRESTOR ) 40 MG tablet TAKE 1 TABLET BY MOUTH DAILY   sertraline  (ZOLOFT ) 50 MG tablet TAKE 1 TABLET BY MOUTH DAILY   ALPRAZolam  (XANAX ) 0.5 MG tablet Take 1 tablet (0.5 mg total) by mouth as directed. Take 1st tablet 1 hour prior to arrival and take 2nd tablet upon arriving to office (Patient not taking: Reported on 06/20/2024)   cyclobenzaprine  (FLEXERIL ) 10 MG tablet Take 1 tablet (10 mg total) by mouth at bedtime. (Patient not taking: Reported on 06/20/2024)   gabapentin  (NEURONTIN ) 300 MG capsule Take 1-2 tablets as needed for back pain. (Patient not taking: Reported on 06/20/2024)   ibuprofen  (ADVIL ,MOTRIN ) 200 MG tablet Take 400 mg by mouth every 6 (six) hours as needed (For headache or body aches.). (Patient not taking: Reported  on 06/20/2024)   No facility-administered medications prior to visit.    Review of Systems  Last CBC Lab Results  Component Value Date   WBC 8.0 08/23/2023   HGB 16.4 08/23/2023   HCT 50.6 08/23/2023   MCV 91 08/23/2023   MCH 29.4 08/23/2023   RDW 13.5 08/23/2023   PLT 210 08/23/2023   Last metabolic panel Lab Results  Component Value Date   GLUCOSE 89 08/23/2023   NA 139 08/23/2023   K 4.8 08/23/2023   CL 103 08/23/2023   CO2 22 08/23/2023   BUN 16 08/23/2023   CREATININE 1.27 08/23/2023   EGFR 68 08/23/2023   CALCIUM  9.5 08/23/2023   PROT 7.1 08/23/2023   ALBUMIN 4.4 08/23/2023   LABGLOB 2.7 08/23/2023   AGRATIO 1.8 01/07/2023   BILITOT 0.6 08/23/2023   ALKPHOS 225 (H) 08/23/2023   AST 27 08/23/2023   ALT 28 08/23/2023   ANIONGAP 9 10/26/2022   Last lipids Lab Results  Component Value Date   CHOL 163 01/07/2023   HDL 39 (L) 01/07/2023   LDLCALC 74 01/07/2023   TRIG 310 (H) 01/07/2023   CHOLHDL 4.2 01/07/2023   Last hemoglobin A1c Lab Results  Component Value Date   HGBA1C 5.9 (H) 01/07/2023   Last thyroid functions Lab Results  Component Value Date   TSH 1.590 01/07/2023   Last  vitamin D No results found for: 25OHVITD2, 25OHVITD3, VD25OH Last vitamin B12 and Folate No results found for: VITAMINB12, FOLATE     Objective    BP 125/84 (BP Location: Left Arm, Patient Position: Sitting, Cuff Size: Normal)   Pulse 72   Temp 98.2 F (36.8 C) (Oral)   Ht 5' 11 (1.803 m)   Wt 215 lb 12.8 oz (97.9 kg)   SpO2 96%   BMI 30.10 kg/m  BP Readings from Last 3 Encounters:  06/20/24 125/84  04/24/24 112/70  02/10/24 (!) 142/89   Wt Readings from Last 3 Encounters:  06/20/24 215 lb 12.8 oz (97.9 kg)  04/24/24 218 lb 12.8 oz (99.2 kg)  02/10/24 237 lb (107.5 kg)        Physical Exam Constitutional:      General: He is not in acute distress.    Appearance: Normal appearance. He is not ill-appearing.  HENT:     Mouth/Throat:     Mouth: Mucous membranes are moist.  Eyes:     General: No scleral icterus.       Right eye: No discharge.        Left eye: No discharge.     Extraocular Movements: Extraocular movements intact.     Conjunctiva/sclera: Conjunctivae normal.     Pupils: Pupils are equal, round, and reactive to light.  Cardiovascular:     Rate and Rhythm: Normal rate and regular rhythm.     Heart sounds: Normal heart sounds.  Pulmonary:     Effort: Pulmonary effort is normal.     Breath sounds: Normal breath sounds.  Abdominal:     General: Abdomen is flat. Bowel sounds are normal. There is no distension.     Palpations: Abdomen is soft. There is no hepatomegaly.     Tenderness: There is abdominal tenderness in the epigastric area.  Musculoskeletal:        General: No deformity or signs of injury. Normal range of motion.     Cervical back: Normal range of motion and neck supple. No tenderness.     Right lower leg: No edema.  Left lower leg: No edema.  Lymphadenopathy:     Cervical: No cervical adenopathy.  Neurological:     Mental Status: He is alert and oriented to person, place, and time.     Cranial Nerves: No cranial nerve  deficit.     Motor: No weakness.     Gait: Gait normal.  Psychiatric:        Mood and Affect: Mood normal.        Behavior: Behavior normal.     Physical Exam     Last depression screening scores    06/20/2024    8:47 AM 10/07/2023    2:28 PM 01/07/2023    3:48 PM  PHQ 2/9 Scores  PHQ - 2 Score 0 0 0  PHQ- 9 Score 9 2 0    Last fall risk screening    06/20/2024    8:44 AM  Fall Risk   Falls in the past year? 1  Number falls in past yr: 0  Injury with Fall? 1  Comment Fell on back at work on steps.    Last Audit-C alcohol use screening    10/18/2023    7:55 AM  Alcohol Use Disorder Test (AUDIT)  1. How often do you have a drink containing alcohol? 2  2. How many drinks containing alcohol do you have on a typical day when you are drinking? 1  3. How often do you have six or more drinks on one occasion? 1  AUDIT-C Score 4   4. How often during the last year have you found that you were not able to stop drinking once you had started? 0  5. How often during the last year have you failed to do what was normally expected from you because of drinking? 0  6. How often during the last year have you needed a first drink in the morning to get yourself going after a heavy drinking session? 0  7. How often during the last year have you had a feeling of guilt of remorse after drinking? 0  8. How often during the last year have you been unable to remember what happened the night before because you had been drinking? 0  9. Have you or someone else been injured as a result of your drinking? 0  10. Has a relative or friend or a doctor or another health worker been concerned about your drinking or suggested you cut down? 0  Alcohol Use Disorder Identification Test Final Score (AUDIT) 4      Patient-reported   A score of 3 or more in women, and 4 or more in men indicates increased risk for alcohol abuse, EXCEPT if all of the points are from question 1   No results found for any  visits on 06/20/24.  Assessment & Plan    Routine Health Maintenance and Physical Exam  Immunization History  Administered Date(s) Administered   PFIZER(Purple Top)SARS-COV-2 Vaccination 08/25/2020, 09/15/2020   PPD Test 11/04/2015   Tdap 12/27/2018    Health Maintenance  Topic Date Due   Hepatitis B Vaccines (1 of 3 - 19+ 3-dose series) Never done   Lung Cancer Screening  Never done   COVID-19 Vaccine (3 - 2024-25 season) 07/24/2023   Zoster Vaccines- Shingrix (1 of 2) 09/20/2024 (Originally 12/24/2020)   Pneumococcal Vaccine 69-39 Years old (1 of 2 - PCV) 06/20/2025 (Originally 12/24/1989)   INFLUENZA VACCINE  06/22/2024   Colonoscopy  01/19/2026   DTaP/Tdap/Td (2 - Td or Tdap) 12/27/2028  Hepatitis C Screening  Completed   HIV Screening  Completed   HPV VACCINES  Aged Out   Meningococcal B Vaccine  Aged Out    Problem List Items Addressed This Visit       Cardiovascular and Mediastinum   Migraine     Digestive   Gastroesophageal reflux disease with esophagitis without hemorrhage   Relevant Orders   CBC   CMP14+EGFR     Other   Tobacco dependence   Relevant Orders   Ambulatory Referral for Lung Cancer Screening [REF832]   Prediabetes   Relevant Orders   Hemoglobin A1c   Hyperlipidemia   Relevant Orders   Lipid panel   Depression, major, single episode, severe (HCC)   Anorexia   Annual physical exam - Primary   Other Visit Diagnoses       Screening for deficiency anemia       Relevant Orders   CBC     Unintentional weight loss       Relevant Orders   TSH+T4F+T3Free     Esophageal dysphagia         Nocturia       Relevant Orders   PSA Total (Reflex To Free)       Assessment & Plan Adult Wellness Visit Annual physical examination conducted. Discussed recommended screenings and vaccinations. - Order CBC, CMP, A1c, and lipid panel to assess for anemia, metabolic function, diabetes status, and cholesterol levels. - Recommend lung cancer screening  with low-dose chest CT. Referral submitted   Tobacco use 30-year history of smoking half a pack to a pack per day, equating to 15 pack years. Increased risk for lung cancer due to smoking history. - Recommend lung cancer screening with low-dose chest CT.  Unintentional weight loss and decreased appetite   Reports unintentional weight loss, decreased appetite, nausea, and epigastric tenderness. Differential includes gastrointestinal issues, potential thyroid dysfunction, and diabetes. History of fatty liver noted. History of difficulty swallowing managed by chewing food thoroughly. - Order TSH, T4, T3 to evaluate thyroid function. - Consider EGD or endoscopy if symptoms persist with GI referral, f/u in 3-4 weeks   Nocturia Reports frequent nocturnal urination, getting up 2-3 times per night. Potential prostate issues discussed as a contributing factor. - Order PSA level to evaluate prostate health.  History of fatty liver Known fatty liver with previous evaluations including ultrasound and blood tests. - Monitor liver function as part of metabolic panel.  History of dysphagia Past episodes of food getting stuck in the esophagus, managed by chewing food thoroughly. No recent severe episodes reported. Risks of esophageal dilation discussed previously and declined due to concerns about complications. - Consider referral to GI for further evaluation if symptoms worsen at follow up   Pre-diabetes Pre-diabetes status to be monitored with A1c testing. Potential link to current symptoms of weight loss and appetite changes discussed. - Monitor A1c levels.     Return in about 1 month (around 07/21/2024) for back pain, nausea/abd pain.       Rockie Agent, MD  [facility] [phone] (phone) [phone] (fax)  [facility]

## 2024-06-21 LAB — LIPID PANEL
Chol/HDL Ratio: 4.2 ratio (ref 0.0–5.0)
Cholesterol, Total: 172 mg/dL (ref 100–199)
HDL: 41 mg/dL (ref >39)
LDL Chol Calc (NIH): 110 mg/dL — ABNORMAL HIGH (ref 0–99)
Triglycerides: 118 mg/dL (ref 0–149)
VLDL Cholesterol Cal: 21 mg/dL (ref 5–40)

## 2024-06-21 LAB — CMP14+EGFR
ALT: 29 IU/L (ref 0–44)
AST: 32 IU/L (ref 0–40)
Albumin: 4.6 g/dL (ref 3.8–4.9)
Alkaline Phosphatase: 236 IU/L — ABNORMAL HIGH (ref 44–121)
BUN/Creatinine Ratio: 16 (ref 9–20)
BUN: 18 mg/dL (ref 6–24)
Bilirubin Total: 0.6 mg/dL (ref 0.0–1.2)
CO2: 20 mmol/L (ref 20–29)
Calcium: 9.5 mg/dL (ref 8.7–10.2)
Chloride: 102 mmol/L (ref 96–106)
Creatinine, Ser: 1.11 mg/dL (ref 0.76–1.27)
Globulin, Total: 3 g/dL (ref 1.5–4.5)
Glucose: 93 mg/dL (ref 70–99)
Potassium: 4.7 mmol/L (ref 3.5–5.2)
Sodium: 138 mmol/L (ref 134–144)
Total Protein: 7.6 g/dL (ref 6.0–8.5)
eGFR: 79 mL/min/1.73 (ref >59)

## 2024-06-21 LAB — CBC
Hematocrit: 52.5 % — ABNORMAL HIGH (ref 37.5–51.0)
Hemoglobin: 17.1 g/dL (ref 13.0–17.7)
MCH: 29.2 pg (ref 26.6–33.0)
MCHC: 32.6 g/dL (ref 31.5–35.7)
MCV: 90 fL (ref 79–97)
Platelets: 208 x10E3/uL (ref 150–450)
RBC: 5.85 x10E6/uL — ABNORMAL HIGH (ref 4.14–5.80)
RDW: 13.7 % (ref 11.6–15.4)
WBC: 9 x10E3/uL (ref 3.4–10.8)

## 2024-06-21 LAB — TSH+T4F+T3FREE
Free T4: 1.18 ng/dL (ref 0.82–1.77)
T3, Free: 4 pg/mL (ref 2.0–4.4)
TSH: 1.32 u[IU]/mL (ref 0.450–4.500)

## 2024-06-21 LAB — PSA TOTAL (REFLEX TO FREE): Prostate Specific Ag, Serum: 1 ng/mL (ref 0.0–4.0)

## 2024-06-21 LAB — HEMOGLOBIN A1C
Est. average glucose Bld gHb Est-mCnc: 128 mg/dL
Hgb A1c MFr Bld: 6.1 % — ABNORMAL HIGH (ref 4.8–5.6)

## 2024-06-22 ENCOUNTER — Ambulatory Visit: Payer: Self-pay | Admitting: Family Medicine

## 2024-06-22 DIAGNOSIS — K219 Gastro-esophageal reflux disease without esophagitis: Secondary | ICD-10-CM

## 2024-06-22 DIAGNOSIS — R6881 Early satiety: Secondary | ICD-10-CM

## 2024-06-22 DIAGNOSIS — D126 Benign neoplasm of colon, unspecified: Secondary | ICD-10-CM

## 2024-06-22 DIAGNOSIS — R748 Abnormal levels of other serum enzymes: Secondary | ICD-10-CM

## 2024-06-22 DIAGNOSIS — R634 Abnormal weight loss: Secondary | ICD-10-CM

## 2024-06-28 ENCOUNTER — Encounter: Payer: Self-pay | Admitting: Family Medicine

## 2024-06-28 ENCOUNTER — Encounter: Admitting: Family Medicine

## 2024-07-16 ENCOUNTER — Telehealth (INDEPENDENT_AMBULATORY_CARE_PROVIDER_SITE_OTHER): Payer: Self-pay | Admitting: Vascular Surgery

## 2024-07-16 NOTE — Telephone Encounter (Signed)
 LVM for pt TCBa dn schedule FOAM sclero appt with Dr. Marea. Explained about the expiration date + scheduling 3 weeks apart + Dr. Fransisca schedule.   left leg FOAM sclero. see jd. shara #[phone] - exp: 7.22.25 - 10.22.25 2 units total.

## 2024-07-17 ENCOUNTER — Ambulatory Visit (INDEPENDENT_AMBULATORY_CARE_PROVIDER_SITE_OTHER): Admitting: Family Medicine

## 2024-07-17 ENCOUNTER — Encounter: Payer: Self-pay | Admitting: Family Medicine

## 2024-07-17 VITALS — BP 116/69 | HR 67 | Resp 16 | Ht 71.0 in | Wt 215.2 lb

## 2024-07-17 DIAGNOSIS — R630 Anorexia: Secondary | ICD-10-CM

## 2024-07-17 DIAGNOSIS — M545 Low back pain, unspecified: Secondary | ICD-10-CM

## 2024-07-17 DIAGNOSIS — R634 Abnormal weight loss: Secondary | ICD-10-CM

## 2024-07-17 DIAGNOSIS — K2100 Gastro-esophageal reflux disease with esophagitis, without bleeding: Secondary | ICD-10-CM

## 2024-07-17 DIAGNOSIS — G8929 Other chronic pain: Secondary | ICD-10-CM

## 2024-07-17 DIAGNOSIS — R748 Abnormal levels of other serum enzymes: Secondary | ICD-10-CM

## 2024-07-17 MED ORDER — MELOXICAM 15 MG PO TABS: 15.0000 mg | ORAL_TABLET | Freq: Every day | ORAL | 0 refills | Status: AC

## 2024-07-17 NOTE — Progress Notes (Signed)
 Established patient visit   Patient: Todd Massey   DOB: [birth year]   53 y.o. Male  MRN: [id number] Visit Date: 07/17/2024  Today's healthcare provider: Rockie Agent, MD   Chief Complaint  Patient presents with   Follow-up    1 month follow-up back pain, abdominal pain   Subjective     HPI     Follow-up    Additional comments: 1 month follow-up back pain, abdominal pain      Last edited by Ellery Gerald BRAVO, CMA on 07/17/2024  1:05 PM.       Discussed the use of AI scribe software for clinical note transcription with the patient, who gave verbal consent to proceed.  History of Present Illness Todd Massey is a 53 year old male who presents with back pain and abdominal pain with decreased appetite.  He has been experiencing ongoing abdominal pain and decreased appetite. He describes a 'yucky, wheezy feeling' in his stomach, which is temporarily alleviated by eating small amounts like a pack of crackers but worsens after larger meals. No vomiting, but he feels as though he might feel better if he did. No difficulty swallowing. He has lost approximately 23 pounds since March, with his weight stabilizing at 215 pounds over the last month. His bowel movements have been darker than usual but not black. He has been on omeprazole  40 mg for a while, which helps manage his symptoms unless he misses a dose.  He is experiencing midline lumbar back pain, which he associates with a previous fall. The pain is located in the lower part of his back and is exacerbated by his work, which involves lifting heavy items. He finds some relief by sitting with his spine erect and pushing back his shoulders. He has taken ibuprofen , which only partially alleviates the pain.  His past medical history includes partial epilepsy, for which he continues to take Levoxyl 100 mg. He is also on sertraline  50 mg daily for depression and dyschromia. He has a history of elevated alkaline phosphatase  levels and still has his gallbladder. His thyroid levels are normal, and he has no diabetes.  He recalls a family history of thyroid problems in his stepbrother, although he is unsure if this is relevant to his own health. He drinks a lot of sodas and works in [employer], which involves physical labor.     Past Medical History:  Diagnosis Date   Allergy    GERD (gastroesophageal reflux disease)    Headache    migraines   Migraine    Pre-diabetes    Seizure-like activity (HCC)     Medications: Outpatient Medications Prior to Visit  Medication Sig   aspirin  81 MG chewable tablet Chew 1 tablet (81 mg total) by mouth daily.   ibuprofen  (ADVIL ,MOTRIN ) 200 MG tablet Take 400 mg by mouth every 6 (six) hours as needed (For headache or body aches.).   lamoTRIgine  (LAMICTAL ) 100 MG tablet Take 1 tablet (100 mg total) by mouth 2 (two) times daily.   Magnesium  500 MG CAPS Take 1 capsule (500 mg total) by mouth daily.   omeprazole  (PRILOSEC) 40 MG capsule Take 1 capsule (40 mg total) by mouth daily.   rosuvastatin  (CRESTOR ) 40 MG tablet TAKE 1 TABLET BY MOUTH DAILY   sertraline  (ZOLOFT ) 50 MG tablet TAKE 1 TABLET BY MOUTH DAILY   [DISCONTINUED] ALPRAZolam  (XANAX ) 0.5 MG tablet Take 1 tablet (0.5 mg total) by mouth as directed. Take 1st tablet 1 hour  prior to arrival and take 2nd tablet upon arriving to office (Patient not taking: Reported on 06/20/2024)   [DISCONTINUED] cyclobenzaprine  (FLEXERIL ) 10 MG tablet Take 1 tablet (10 mg total) by mouth at bedtime. (Patient not taking: Reported on 06/20/2024)   [DISCONTINUED] gabapentin  (NEURONTIN ) 300 MG capsule Take 1-2 tablets as needed for back pain. (Patient not taking: Reported on 07/17/2024)   No facility-administered medications prior to visit.    Review of Systems  Last CBC Lab Results  Component Value Date   WBC 9.0 06/20/2024   HGB 17.1 06/20/2024   HCT 52.5 (H) 06/20/2024   MCV 90 06/20/2024   MCH 29.2 06/20/2024   RDW 13.7  06/20/2024   PLT 208 06/20/2024   Last metabolic panel Lab Results  Component Value Date   GLUCOSE 93 06/20/2024   NA 138 06/20/2024   K 4.7 06/20/2024   CL 102 06/20/2024   CO2 20 06/20/2024   BUN 18 06/20/2024   CREATININE 1.11 06/20/2024   EGFR 79 06/20/2024   CALCIUM  9.5 06/20/2024   PROT 7.6 06/20/2024   ALBUMIN 4.6 06/20/2024   LABGLOB 3.0 06/20/2024   AGRATIO 1.8 01/07/2023   BILITOT 0.6 06/20/2024   ALKPHOS 236 (H) 06/20/2024   AST 32 06/20/2024   ALT 29 06/20/2024   ANIONGAP 9 10/26/2022   Last lipids Lab Results  Component Value Date   CHOL 172 06/20/2024   HDL 41 06/20/2024   LDLCALC 110 (H) 06/20/2024   TRIG 118 06/20/2024   CHOLHDL 4.2 06/20/2024   Last hemoglobin A1c Lab Results  Component Value Date   HGBA1C 6.1 (H) 06/20/2024   Last thyroid functions Lab Results  Component Value Date   TSH 1.320 06/20/2024   Last vitamin D No results found for: 25OHVITD2, 25OHVITD3, VD25OH Last vitamin B12 and Folate No results found for: VITAMINB12, FOLATE      Objective    BP 116/69 (BP Location: Left Arm, Patient Position: Sitting, Cuff Size: Normal)   Pulse 67   Resp 16   Ht 5' 11 (1.803 m)   Wt 215 lb 3.2 oz (97.6 kg)   SpO2 97%   BMI 30.01 kg/m  BP Readings from Last 3 Encounters:  07/17/24 116/69  06/20/24 125/84  04/24/24 112/70   Wt Readings from Last 3 Encounters:  07/17/24 215 lb 3.2 oz (97.6 kg)  06/20/24 215 lb 12.8 oz (97.9 kg)  04/24/24 218 lb 12.8 oz (99.2 kg)        Physical Exam  Physical Exam MEASUREMENTS: Weight- 215. ABDOMEN: Decreased bowel sounds. Gastric abdominal tenderness. MUSCULOSKELETAL: Midline lumbar region tenderness to palpation. Normal range of motion. NEUROLOGICAL: Normal gait. SKIN: Skin of lumbar region normal.    No results found for any visits on 07/17/24.  Assessment & Plan     Problem List Items Addressed This Visit     Gastroesophageal reflux disease with esophagitis without  hemorrhage - Primary   Other Visit Diagnoses       Decreased appetite         Unintentional weight loss         Chronic midline low back pain without sciatica       Relevant Medications   meloxicam  (MOBIC ) 15 MG tablet   Other Relevant Orders   Ambulatory referral to Physical Therapy     Alkaline phosphatase elevation       Relevant Orders   US  Abdomen Limited RUQ (LIVER/GB)       Assessment and Plan Assessment & Plan  Abdominal pain with nausea Chronic abdominal pain with nausea and decreased appetite, worsened postprandially. No vomiting or dysphagia. Weight loss of 23 pounds since March, currently 215 pounds. Elevated alkaline phosphatase suggests possible hepatobiliary issues. Differential includes gallstones or obstruction. No diabetes or thyroid dysfunction. Regular bowel movements, darker stools. GERD symptoms controlled with omeprazole . - Contact GI provider to expedite appointment currently scheduled for November 19, 2024. - Order right upper quadrant ultrasound to evaluate for gallstones or liver inflammation. - Hold H. pylori testing due to chronic PPI use. - Consider MRCP if recommended by GI specialist.  Chronic lumbar back pain with degenerative disc disease Chronic lumbar back pain with degenerative disc disease, possibly exacerbated by myofascial irritation. Pain localized to midline lumbar region, tender to palpation, alleviated by sitting erect. No acute fracture or traumatic lysis. Degenerative changes at T12-L1. Back pain not associated with abdominal pain. - Prescribe meloxicam  15 mg daily for anti-inflammatory effect. - Refer to physical therapy for back pain management. - Consider orthopedic referral and MRI if no improvement with current management.  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 - Continue Lamictal  100 mg as prescribed.  Depression and dysthymia Chronic depression, symptoms well controlled  -  Continue sertraline  50 mg daily.  Gastroesophageal reflux disease (GERD) Chronic GERD symptoms well-controlled with omeprazole , worsen if medication is missed. - Continue omeprazole  40 mg as prescribed.     No follow-ups on file.         Rockie Agent, MD  [facility] [phone] (phone) [phone] (fax)  [facility]

## 2024-07-22 ENCOUNTER — Other Ambulatory Visit: Payer: Self-pay | Admitting: Family Medicine

## 2024-07-22 DIAGNOSIS — K2100 Gastro-esophageal reflux disease with esophagitis, without bleeding: Secondary | ICD-10-CM

## 2024-07-24 ENCOUNTER — Telehealth (INDEPENDENT_AMBULATORY_CARE_PROVIDER_SITE_OTHER): Payer: Self-pay | Admitting: Vascular Surgery

## 2024-07-24 NOTE — Telephone Encounter (Signed)
 LVM for pt TCB and schedule FOAM sclero.  left leg FOAM sclero. see jd. shara #[id number] - exp: 7.22.25 - 10.22.25 2 units total. I advised again of the time crunch with scheduling 4 weeks apart, expiration date and Dr. Fransisca schedule. I asked for a return phone call if patient is not wanting to schedule.

## 2024-07-26 ENCOUNTER — Ambulatory Visit

## 2024-07-27 ENCOUNTER — Ambulatory Visit
Admission: RE | Admit: 2024-07-27 | Discharge: 2024-07-27 | Disposition: A | Discharge status: Home / Self Care | Source: Ambulatory Visit | Attending: Family Medicine | Admitting: Family Medicine

## 2024-07-27 DIAGNOSIS — R109 Unspecified abdominal pain: Secondary | ICD-10-CM | POA: Diagnosis not present

## 2024-07-27 DIAGNOSIS — R748 Abnormal levels of other serum enzymes: Secondary | ICD-10-CM | POA: Diagnosis not present

## 2024-08-01 ENCOUNTER — Ambulatory Visit: Payer: Self-pay | Admitting: Family Medicine

## 2024-08-01 DIAGNOSIS — K760 Fatty (change of) liver, not elsewhere classified: Secondary | ICD-10-CM | POA: Insufficient documentation

## 2024-08-12 ENCOUNTER — Other Ambulatory Visit: Payer: Self-pay | Admitting: Family Medicine

## 2024-08-12 DIAGNOSIS — F322 Major depressive disorder, single episode, severe without psychotic features: Secondary | ICD-10-CM

## 2024-08-23 ENCOUNTER — Ambulatory Visit: Payer: BC Managed Care – PPO | Admitting: Neurology

## 2024-09-24 DIAGNOSIS — R634 Abnormal weight loss: Secondary | ICD-10-CM | POA: Diagnosis not present

## 2024-09-24 DIAGNOSIS — K219 Gastro-esophageal reflux disease without esophagitis: Secondary | ICD-10-CM | POA: Diagnosis not present

## 2024-10-01 ENCOUNTER — Encounter: Payer: Self-pay | Admitting: Gastroenterology

## 2024-10-01 ENCOUNTER — Encounter
Admission: RE | Disposition: A | Discharge status: Home / Self Care | Payer: Self-pay | Source: Home / Self Care | Attending: Gastroenterology

## 2024-10-01 ENCOUNTER — Ambulatory Visit: Payer: Self-pay

## 2024-10-01 ENCOUNTER — Ambulatory Visit
Admission: RE | Admit: 2024-10-01 | Discharge: 2024-10-01 | Disposition: A | Discharge status: Home / Self Care | Attending: Gastroenterology | Admitting: Gastroenterology

## 2024-10-01 DIAGNOSIS — R569 Unspecified convulsions: Secondary | ICD-10-CM | POA: Diagnosis not present

## 2024-10-01 DIAGNOSIS — R519 Headache, unspecified: Secondary | ICD-10-CM | POA: Diagnosis not present

## 2024-10-01 DIAGNOSIS — F32A Depression, unspecified: Secondary | ICD-10-CM | POA: Insufficient documentation

## 2024-10-01 DIAGNOSIS — F17210 Nicotine dependence, cigarettes, uncomplicated: Secondary | ICD-10-CM | POA: Insufficient documentation

## 2024-10-01 DIAGNOSIS — K219 Gastro-esophageal reflux disease without esophagitis: Secondary | ICD-10-CM | POA: Insufficient documentation

## 2024-10-01 DIAGNOSIS — K2270 Barrett's esophagus without dysplasia: Secondary | ICD-10-CM | POA: Diagnosis not present

## 2024-10-01 DIAGNOSIS — Z791 Long term (current) use of non-steroidal anti-inflammatories (NSAID): Secondary | ICD-10-CM | POA: Diagnosis not present

## 2024-10-01 DIAGNOSIS — J449 Chronic obstructive pulmonary disease, unspecified: Secondary | ICD-10-CM | POA: Diagnosis not present

## 2024-10-01 DIAGNOSIS — Z79899 Other long term (current) drug therapy: Secondary | ICD-10-CM | POA: Insufficient documentation

## 2024-10-01 DIAGNOSIS — Z7982 Long term (current) use of aspirin: Secondary | ICD-10-CM | POA: Insufficient documentation

## 2024-10-01 DIAGNOSIS — E785 Hyperlipidemia, unspecified: Secondary | ICD-10-CM | POA: Diagnosis not present

## 2024-10-01 DIAGNOSIS — K2289 Other specified disease of esophagus: Secondary | ICD-10-CM | POA: Diagnosis not present

## 2024-10-01 SURGERY — EGD (ESOPHAGOGASTRODUODENOSCOPY)
Anesthesia: General

## 2024-10-01 MED ORDER — LIDOCAINE HCL (CARDIAC) PF 100 MG/5ML IV SOSY
PREFILLED_SYRINGE | INTRAVENOUS | Status: DC | PRN
  Administered 2024-10-01: 50 mg via INTRAVENOUS

## 2024-10-01 MED ORDER — SODIUM CHLORIDE 0.9 % IV SOLN: INTRAVENOUS | Status: DC

## 2024-10-01 MED ORDER — PROPOFOL 500 MG/50ML IV EMUL
INTRAVENOUS | Status: DC | PRN
  Administered 2024-10-01: 100 mg via INTRAVENOUS
  Administered 2024-10-01: 150 ug/kg/min via INTRAVENOUS

## 2024-10-01 MED ORDER — GLYCOPYRROLATE 0.2 MG/ML IJ SOLN
INTRAMUSCULAR | Status: DC | PRN
  Administered 2024-10-01: .2 mg via INTRAVENOUS

## 2024-10-01 MED ORDER — DEXMEDETOMIDINE HCL IN NACL 80 MCG/20ML IV SOLN
INTRAVENOUS | Status: DC | PRN
  Administered 2024-10-01: 12 ug via INTRAVENOUS

## 2024-10-01 NOTE — Anesthesia Preprocedure Evaluation (Signed)
 Anesthesia Evaluation  Patient identified by MRN, date of birth, ID band Patient awake    Reviewed: Allergy & Precautions, NPO status , Patient's Chart, lab work & pertinent test results  Airway Mallampati: II  TM Distance: >3 FB Neck ROM: Full    Dental  (+) Missing, Poor Dentition, Dental Advisory Given   Pulmonary neg pulmonary ROS, COPD, Current Smoker and Patient abstained from smoking.   Pulmonary exam normal  + decreased breath sounds      Cardiovascular Exercise Tolerance: Good negative cardio ROS Normal cardiovascular exam Rhythm:Regular Rate:Normal     Neuro/Psych  Headaches, Seizures -,    Depression    negative neurological ROS  negative psych ROS   GI/Hepatic negative GI ROS, Neg liver ROS,GERD  Medicated,,  Endo/Other  negative endocrine ROS    Renal/GU negative Renal ROS  negative genitourinary   Musculoskeletal   Abdominal   Peds negative pediatric ROS (+)  Hematology negative hematology ROS (+)   Anesthesia Other Findings Past Medical History: No date: Allergy No date: GERD (gastroesophageal reflux disease) No date: Headache     Comment:  migraines No date: Migraine No date: Pre-diabetes No date: Seizure-like activity [facility])  Past Surgical History: No date: APPENDECTOMY 01/20/2023: COLONOSCOPY WITH PROPOFOL ; N/A     Comment:  Procedure: COLONOSCOPY WITH PROPOFOL ;  Surgeon: Therisa Bi, MD;  Location: [facility] ENDOSCOPY;  Service:               Gastroenterology;  Laterality: N/A; 03/31/2016: EUS; N/A     Comment:  Procedure: ESOPHAGEAL ENDOSCOPIC ULTRASOUND (EUS)               RADIAL;  Surgeon: Elsie Cree, MD;  Location: [facility]               ENDOSCOPY;  Service: Endoscopy;  Laterality: N/A;  BMI    Body Mass Index: 28.87 kg/m      Reproductive/Obstetrics negative OB ROS                              Anesthesia Physical Anesthesia Plan  ASA:  3  Anesthesia Plan: General   Post-op Pain Management:    Induction:   PONV Risk Score and Plan: Propofol  infusion and TIVA  Airway Management Planned: Natural Airway and Nasal Cannula  Additional Equipment:   Intra-op Plan:   Post-operative Plan:   Informed Consent: I have reviewed the patients History and Physical, chart, labs and discussed the procedure including the risks, benefits and alternatives for the proposed anesthesia with the patient or authorized representative who has indicated his/her understanding and acceptance.     Dental Advisory Given  Plan Discussed with: CRNA  Anesthesia Plan Comments:         Anesthesia Quick Evaluation

## 2024-10-01 NOTE — H&P (Signed)
 Ruel Kung , MD [address], [place], [place], [address] Phone: [phone] Fax: [phone]  Primary Care Physician:  Sharma Coyer, MD   Pre-Procedure History & Physical: HPI:  Todd Massey is a 53 y.o. male is here for an endoscopy    Past Medical History:  Diagnosis Date   Allergy    GERD (gastroesophageal reflux disease)    Headache    migraines   Migraine    Pre-diabetes    Seizure-like activity [facility])     Past Surgical History:  Procedure Laterality Date   APPENDECTOMY     COLONOSCOPY WITH PROPOFOL  N/A 01/20/2023   Procedure: COLONOSCOPY WITH PROPOFOL ;  Surgeon: Kung Ruel, MD;  Location: [facility] ENDOSCOPY;  Service: Gastroenterology;  Laterality: N/A;   EUS N/A 03/31/2016   Procedure: ESOPHAGEAL ENDOSCOPIC ULTRASOUND (EUS) RADIAL;  Surgeon: Elsie Cree, MD;  Location: [facility] ENDOSCOPY;  Service: Endoscopy;  Laterality: N/A;    Prior to Admission medications   Medication Sig Start Date End Date Taking? Authorizing Provider  aspirin  81 MG chewable tablet Chew 1 tablet (81 mg total) by mouth daily. 01/07/23   Emilio Kelly DASEN, FNP  ibuprofen  (ADVIL ,MOTRIN ) 200 MG tablet Take 400 mg by mouth every 6 (six) hours as needed (For headache or body aches.).    [provider]  lamoTRIgine  (LAMICTAL ) 100 MG tablet Take 1 tablet (100 mg total) by mouth 2 (two) times daily. 08/23/23   Gayland Lauraine PARAS, NP  Magnesium  500 MG CAPS Take 1 capsule (500 mg total) by mouth daily. 11/24/21   Emilio Kelly DASEN, FNP  meloxicam  (MOBIC ) 15 MG tablet Take 1 tablet (15 mg total) by mouth daily. 07/17/24   Simmons-Robinson, Coyer, MD  omeprazole  (PRILOSEC) 40 MG capsule Take 1 capsule (40 mg total) by mouth daily. 03/06/24   Donzella Lauraine SAILOR, DO  rosuvastatin  (CRESTOR ) 40 MG tablet TAKE 1 TABLET BY MOUTH DAILY 11/29/23   Clifton, Kellie A, FNP  sertraline  (ZOLOFT ) 50 MG tablet TAKE 1 TABLET BY MOUTH DAILY 08/13/24   Donzella Lauraine SAILOR, DO    Allergies as of 09/24/2024 -  Review Complete 07/17/2024  Allergen Reaction Noted   Penicillins Rash and Other (See Comments) 04/09/2015    Family History  Problem Relation Age of Onset   Diabetes Mother    Other Father        unsure of medical history    Social History   Socioeconomic History   Marital status: Single    Spouse name: Not on file   Number of children: 2   Years of education: 12   Highest education level: 12th grade  Occupational History   Occupation: produce associate    Comment: Designer, Jewellery  Tobacco Use   Smoking status: Every Day    Current packs/day: 1.00    Average packs/day: 1 pack/day for 30.0 years (30.0 ttl pk-yrs)    Types: Cigarettes   Smokeless tobacco: Never  Vaping Use   Vaping status: Never Used  Substance and Sexual Activity   Alcohol use: Yes    Comment: occ   Drug use: Never   Sexual activity: Not on file  Other Topics Concern   Not on file  Social History Narrative   Lives at home with significant other.   Right-handed.   One 16oz soda per day,  16oz sweet tea per day, occasional coffee.   Social Drivers of Health   Financial Resource Strain: Low Risk  (09/24/2024)   [facility]   Overall Financial Resource Strain (CARDIA)    Difficulty of Paying Living Expenses: Not hard at all  Food Insecurity: No Food Insecurity (09/24/2024)   Received from [facility]   Hunger Vital Sign    Within the past 12 months, you worried that your food would run out before you got the money to buy more.: Never true    Within the past 12 months, the food you bought just didn't last and you didn't have money to get more.: Never true  Transportation Needs: No Transportation Needs (09/24/2024)   Received from [facility] - Transportation    In the past 12 months, has lack of transportation kept you from medical appointments or from getting medications?: No    Lack of Transportation (Non-Medical): No   Physical Activity: Unknown (10/18/2023)   Exercise Vital Sign    Days of Exercise per Week: Patient declined    Minutes of Exercise per Session: Not on file  Stress: No Stress Concern Present (10/18/2023)   [employer] of Occupational [employer] - Occupational Stress Questionnaire    Feeling of Stress : Not at all  Social Connections: Moderately Isolated (10/18/2023)   Social Connection and Isolation Panel    Frequency of Communication with Friends and Family: More than three times a week    Frequency of Social Gatherings with Friends and Family: More than three times a week    Attends Religious Services: Never    Database Administrator or Organizations: No    Attends Engineer, Structural: Not on file    Marital Status: Living with partner  Intimate Partner Violence: Not on file    Review of Systems: See HPI, otherwise negative ROS  Physical Exam: There were no vitals taken for this visit. General:   Alert,  pleasant and cooperative in NAD Head:  Normocephalic and atraumatic. Neck:  Supple; no masses or thyromegaly. Lungs:  Clear throughout to auscultation, normal respiratory effort.    Heart:  +S1, +S2, Regular rate and rhythm, No edema. Abdomen:  Soft, nontender and nondistended. Normal bowel sounds, without guarding, and without rebound.   Neurologic:  Alert and  oriented x4;  grossly normal neurologically.  Impression/Plan: Todd Massey is here for an endoscopy  to be performed for  evaluation of GERD    Risks, benefits, limitations, and alternatives regarding endoscopy have been reviewed with the patient.  Questions have been answered.  All parties agreeable.   Ruel Kung, MD  10/01/2024, 10:39 AM

## 2024-10-01 NOTE — Op Note (Addendum)
 [facility] Gastroenterology Patient Name: Todd Massey Procedure Date: 10/01/2024 11:03 AM MRN: [id number] Account #: [id number] Date of Birth: [birth year] Admit Type: Outpatient Age: 53 Room: [facility] ENDO ROOM 1 Gender: Male Note Status: Supervisor Override Instrument Name: Endoscope [id number] Procedure:             Upper GI endoscopy Indications:           Screening for Barrett's esophagus, Screening for                         Barrett's esophagus in patient at risk for this                         condition, Follow-up of esophageal reflux Providers:             Ruel Kung MD, MD Medicines:             Monitored Anesthesia Care Complications:         No immediate complications. Procedure:             Pre-Anesthesia Assessment:                        - Prior to the procedure, a History and Physical was                         performed, and patient medications, allergies and                         sensitivities were reviewed. The patient's tolerance                         of previous anesthesia was reviewed.                        - The risks and benefits of the procedure and the                         sedation options and risks were discussed with the                         patient. All questions were answered and informed                         consent was obtained.                        - ASA Grade Assessment: II - A patient with mild                         systemic disease.                        After obtaining informed consent, the endoscope was                         passed under direct vision. Throughout the procedure,                         the patient's blood pressure, pulse, and oxygen  saturations were monitored continuously. The Endoscope                         was introduced through the mouth, and advanced to the                         third part of duodenum. The upper GI endoscopy was                          accomplished with ease. The patient tolerated the                         procedure well. Findings:      The examined duodenum was normal.      The stomach was normal.      The cardia and gastric fundus were normal on retroflexion.      One tongue of salmon-colored mucosa was present. No other visible       abnormalities were present. The maximum longitudinal extent of these       esophageal mucosal changes was 1 cm in length. Biopsies were taken with       a cold forceps for histology. Impression:            - Normal examined duodenum.                        - Normal stomach.                        - Salmon-colored mucosa suspicious for short-segment                         Barrett's esophagus. Biopsied. Recommendation:        - Await pathology results.                        - Discharge patient to home (with escort).                        - Resume previous diet.                        - Continue present medications.                        - Return to my office as previously scheduled. Procedure Code(s):     --- Professional ---                        [phone], Esophagogastroduodenoscopy, flexible,                         transoral; with biopsy, single or multiple CPT copyright 2022 American Medical Association. All rights reserved. The codes documented in this report are preliminary and upon coder review may  be revised to meet current compliance requirements. Ruel Kung, MD Ruel Kung MD, MD 10/01/2024 11:19:04 AM This report has been signed electronically. Number of Addenda: 0 Note Initiated On: 10/01/2024 11:03 AM Estimated Blood Loss:  Estimated blood loss: none.      [facility]

## 2024-10-01 NOTE — Transfer of Care (Signed)
 Immediate Anesthesia Transfer of Care Note  Patient: Todd Massey  Procedure(s) Performed: EGD (ESOPHAGOGASTRODUODENOSCOPY)  Patient Location: Endoscopy Unit  Anesthesia Type:General  Level of Consciousness: awake and drowsy  Airway & Oxygen Therapy: Patient Spontanous Breathing  Post-op Assessment: Report given to RN and Post -op Vital signs reviewed and stable  Post vital signs: Reviewed and stable  Last Vitals:  Vitals Value Taken Time  BP 95/76 10/01/24 11:24  Temp    Pulse 77 10/01/24 11:25  Resp 17 10/01/24 11:25  SpO2 98 % 10/01/24 11:25  Vitals shown include unfiled device data.  Last Pain:  Vitals:   10/01/24 1054  TempSrc: Temporal         Complications: No notable events documented.

## 2024-10-01 NOTE — Anesthesia Postprocedure Evaluation (Signed)
 Anesthesia Post Note  Patient: Todd Massey  Procedure(s) Performed: EGD (ESOPHAGOGASTRODUODENOSCOPY)  Patient location during evaluation: PACU Anesthesia Type: General Level of consciousness: awake Pain management: satisfactory to patient Vital Signs Assessment: post-procedure vital signs reviewed and stable Respiratory status: spontaneous breathing Cardiovascular status: stable Anesthetic complications: no   No notable events documented.   Last Vitals:  Vitals:   10/01/24 1124 10/01/24 1134  BP: 95/76 102/69  Pulse: 77 61  Resp: (!) 21 17  Temp:    SpO2: 97% 96%    Last Pain:  Vitals:   10/01/24 1054  TempSrc: Temporal                 VAN STAVEREN,Lailoni Baquera

## 2024-10-01 NOTE — Anesthesia Procedure Notes (Signed)
 Date/Time: 10/01/2024 11:08 AM  Performed by: Dominica Krabbe, CRNAPre-anesthesia Checklist: Patient identified, Emergency Drugs available, Suction available, Patient being monitored and Timeout performed Patient Re-evaluated:Patient Re-evaluated prior to induction Oxygen Delivery Method: Nasal cannula Preoxygenation: Pre-oxygenation with 100% oxygen Induction Type: IV induction

## 2024-10-02 LAB — SURGICAL PATHOLOGY

## 2024-10-07 ENCOUNTER — Other Ambulatory Visit: Payer: Self-pay | Admitting: Family Medicine

## 2024-10-07 DIAGNOSIS — E785 Hyperlipidemia, unspecified: Secondary | ICD-10-CM

## 2024-12-24 ENCOUNTER — Other Ambulatory Visit: Payer: Self-pay | Admitting: Family Medicine

## 2024-12-24 DIAGNOSIS — K2100 Gastro-esophageal reflux disease with esophagitis, without bleeding: Secondary | ICD-10-CM
